# Patient Record
Sex: Male | Born: 1940
Health system: Southern US, Community
[De-identification: ages and names within clinical notes are randomized; demographics above are authoritative.]

## PROBLEM LIST (undated history)

## (undated) DIAGNOSIS — R12 Heartburn: Secondary | ICD-10-CM

## (undated) DIAGNOSIS — R740 Nonspecific elevation of levels of transaminase and lactic acid dehydrogenase [LDH]: Secondary | ICD-10-CM

## (undated) DIAGNOSIS — R6881 Early satiety: Secondary | ICD-10-CM

## (undated) DIAGNOSIS — R7401 Elevation of levels of liver transaminase levels: Secondary | ICD-10-CM

## (undated) DIAGNOSIS — S61209A Unspecified open wound of unspecified finger without damage to nail, initial encounter: Secondary | ICD-10-CM

## (undated) DIAGNOSIS — I10 Essential (primary) hypertension: Secondary | ICD-10-CM

## (undated) DIAGNOSIS — R1084 Generalized abdominal pain: Secondary | ICD-10-CM

## (undated) DIAGNOSIS — E039 Hypothyroidism, unspecified: Secondary | ICD-10-CM

## (undated) DIAGNOSIS — Z9889 Other specified postprocedural states: Secondary | ICD-10-CM

## (undated) DIAGNOSIS — J61 Pneumoconiosis due to asbestos and other mineral fibers: Secondary | ICD-10-CM

## (undated) DIAGNOSIS — F0390 Unspecified dementia without behavioral disturbance: Secondary | ICD-10-CM

## (undated) DIAGNOSIS — K219 Gastro-esophageal reflux disease without esophagitis: Secondary | ICD-10-CM

## (undated) DIAGNOSIS — F259 Schizoaffective disorder, unspecified: Secondary | ICD-10-CM

## (undated) HISTORY — PX: KNEE CARTILAGE SURGERY: SHX688

## (undated) HISTORY — DX: Heartburn: R12

## (undated) HISTORY — DX: Unspecified open wound of unspecified finger without damage to nail, initial encounter: S61.209A

## (undated) HISTORY — DX: Nonspecific elevation of levels of transaminase and lactic acid dehydrogenase (ldh): R74.0

## (undated) HISTORY — DX: Generalized abdominal pain: R10.84

## (undated) HISTORY — DX: Pneumoconiosis due to asbestos and other mineral fibers: J61

## (undated) HISTORY — PX: CHOLECYSTECTOMY: SHX55

## (undated) HISTORY — DX: Early satiety: R68.81

## (undated) HISTORY — DX: Elevation of levels of liver transaminase levels: R74.01

## (undated) HISTORY — DX: Other specified postprocedural states: Z98.890

---

## 2011-01-17 ENCOUNTER — Encounter (INDEPENDENT_AMBULATORY_CARE_PROVIDER_SITE_OTHER): Payer: Self-pay

## 2011-01-17 ENCOUNTER — Encounter (INDEPENDENT_AMBULATORY_CARE_PROVIDER_SITE_OTHER): Payer: Self-pay | Admitting: *Deleted

## 2011-02-08 ENCOUNTER — Encounter (INDEPENDENT_AMBULATORY_CARE_PROVIDER_SITE_OTHER): Payer: Self-pay | Admitting: Internal Medicine

## 2011-02-08 ENCOUNTER — Ambulatory Visit (INDEPENDENT_AMBULATORY_CARE_PROVIDER_SITE_OTHER): Payer: Medicare Other | Admitting: Internal Medicine

## 2011-02-08 DIAGNOSIS — R141 Gas pain: Secondary | ICD-10-CM

## 2011-02-08 DIAGNOSIS — R748 Abnormal levels of other serum enzymes: Secondary | ICD-10-CM

## 2011-02-08 DIAGNOSIS — R140 Abdominal distension (gaseous): Secondary | ICD-10-CM

## 2011-02-08 DIAGNOSIS — R6881 Early satiety: Secondary | ICD-10-CM

## 2011-02-08 DIAGNOSIS — R1011 Right upper quadrant pain: Secondary | ICD-10-CM

## 2011-02-08 LAB — CBC WITH DIFFERENTIAL/PLATELET
Basophils Absolute: 0 10*3/uL (ref 0.0–0.1)
Eosinophils Relative: 5 % (ref 0–5)
HCT: 42.9 % (ref 39.0–52.0)
Hemoglobin: 14 g/dL (ref 13.0–17.0)
Lymphocytes Relative: 35 % (ref 12–46)
Lymphs Abs: 1.5 10*3/uL (ref 0.7–4.0)
MCV: 100.2 fL — ABNORMAL HIGH (ref 78.0–100.0)
Monocytes Absolute: 0.5 10*3/uL (ref 0.1–1.0)
Monocytes Relative: 11 % (ref 3–12)
RDW: 13.9 % (ref 11.5–15.5)
WBC: 4.2 10*3/uL (ref 4.0–10.5)

## 2011-02-08 LAB — COMPREHENSIVE METABOLIC PANEL
Albumin: 4.4 g/dL (ref 3.5–5.2)
BUN: 18 mg/dL (ref 6–23)
CO2: 22 mEq/L (ref 19–32)
Calcium: 9.3 mg/dL (ref 8.4–10.5)
Chloride: 104 mEq/L (ref 96–112)
Creat: 1.04 mg/dL (ref 0.50–1.35)
Glucose, Bld: 91 mg/dL (ref 70–99)

## 2011-02-08 NOTE — Progress Notes (Signed)
Subjective:     Patient ID: Jonathon Grimes, male   DOB: 06/29/1940, 70 y.o.   MRN: [id number]  HPI  Jonathon Grimes is a 70 yr old male referred to our office for elevated transaminases. He tells me his liver enzymes have never been elevated before.  He tells me every now and then he rt upper quadrant pain.  He has had the pain for a couple of months. He has a hx of asbestosis which was diagnosed in 2001.  Appetite is good. No weight loss. He actually gain about 20lbs in the past 4-5 months.  He says he cannot digest a whole hamburger. He says he will get abdominal distention. He c/o early satiety. Symptoms of early satiety for the past 4-5 months. If he drinks Gatorade his stomach will become tight.  He denies acid reflux since his gallbladder was removed. He has a BM 1-2 times a day.  No rectal bleeding or melena.  No nausea, vomiting, or chills, no fever.  Denies etoh abuse.  No meds that would pump his transaminases up.    He had an US abdomen 12/06/2010 for early satiety.  CBD measures approximately 3-4 mm in greatest diameter intraluminal diameter. The gallbladder is surgically absent.  Left renal cyst. Calcifying atherosclerotic disease. ([facility])   Hereceived Hepatitis B vaccine in 1996  7/13/2012Hep A Ab, IgM negative, HBsAg Screen Negative, Hep B Core Ab, IgM negative, He C Ab negative.  12/29/2010 albumin 4.3, Total bili 0.2, direct 0.04, ALP 82, AST 169, ALT 70  Review of Systems see hpi Current Outpatient Prescriptions  Medication Sig Dispense Refill  . esomeprazole (NEXIUM) 40 MG capsule Take 40 mg by mouth daily before breakfast.         History   Social History Narrative  . No narrative on file   History   Social History  . Marital Status: Widowed    Spouse Name: N/A    Number of Children: N/A  . Years of Education: N/A   Occupational History  . Not on file.   Social History Main Topics  . Smoking status: Current Some Day Smoker  . Smokeless tobacco: Not on file   Comment: 1 pack a day  . Alcohol Use: No  . Drug Use: No  . Sexually Active: Not on file   Other Topics Concern  . Not on file   Social History Narrative  . No narrative on file   No family history on file. Family Status  Relation Status Death Age  . Mother Deceased     Liver cancer  . Father Deceased     MI  . Sister Alive     fair health  . Brother Other     One deceased: killed in [place], one alive in good health  . Child Other     One deceased from colon cancer at age 1, and one in good health.    Allergies  Allergen Reactions  . Celebrex (Celecoxib)    Past Surgical History  Procedure Date  . Cholecystectomy     1994 [place]: gallstones  . Knee cartilage surgery     Bilateral       Objective:   Physical ExamAlert and oriented. Skin warm and dry. Oral mucosa is moist. Natural teeth in good condition. Sclera anicteric, conjunctivae is pink. Thyroid not enlarged. No cervical lymphadenopathy. Lungs clear. Heart regular rate and rhythm.  Abdomen is soft. Bowel sounds are positive. I am unable to palpate his liver. Abdomen  is obese. No abdominal masses felt. No tenderness.  No edema to lower extremities. Patient is alert and oriented.      Assessment:    Elevated liver enzymes Abdominal distention, Early satiety. Liver disease such as an auto immune hepatitis process  needs to be ruled out.   . Would like to repeat to see if they have come down.  Early satiety.    Plan:    C-met, Will also get a CT abdomen with CM    I will discuss this case with Dr. Karilyn Cota.  He may need an EGD in the near future for early satiety, and abdominal distention.

## 2011-02-09 LAB — SEDIMENTATION RATE: Sed Rate: 9 mm/hr (ref 0–16)

## 2011-02-14 ENCOUNTER — Ambulatory Visit (HOSPITAL_COMMUNITY)
Admission: RE | Admit: 2011-02-14 | Discharge: 2011-02-14 | Disposition: A | Discharge status: Home / Self Care | Payer: Medicare Other | Source: Ambulatory Visit | Attending: Internal Medicine | Admitting: Internal Medicine

## 2011-02-14 DIAGNOSIS — R6881 Early satiety: Secondary | ICD-10-CM

## 2011-02-14 DIAGNOSIS — R1011 Right upper quadrant pain: Secondary | ICD-10-CM | POA: Insufficient documentation

## 2011-02-14 DIAGNOSIS — R748 Abnormal levels of other serum enzymes: Secondary | ICD-10-CM | POA: Insufficient documentation

## 2011-02-14 DIAGNOSIS — K7689 Other specified diseases of liver: Secondary | ICD-10-CM | POA: Insufficient documentation

## 2011-02-14 DIAGNOSIS — K5730 Diverticulosis of large intestine without perforation or abscess without bleeding: Secondary | ICD-10-CM | POA: Insufficient documentation

## 2011-02-14 DIAGNOSIS — R140 Abdominal distension (gaseous): Secondary | ICD-10-CM

## 2011-02-14 MED ORDER — IOHEXOL 300 MG/ML  SOLN
100.0000 mL | Freq: Once | INTRAMUSCULAR | Status: AC | PRN
  Administered 2011-02-14: 100 mL via INTRAVENOUS

## 2011-02-16 ENCOUNTER — Telehealth (INDEPENDENT_AMBULATORY_CARE_PROVIDER_SITE_OTHER): Payer: Self-pay | Admitting: Internal Medicine

## 2011-02-16 DIAGNOSIS — K769 Liver disease, unspecified: Secondary | ICD-10-CM

## 2011-02-16 NOTE — Telephone Encounter (Signed)
Results have been given to patient. He will need an MR abdomen with/without contrast

## 2011-02-16 NOTE — Telephone Encounter (Signed)
Addended by: Len Blalock on: 02/16/2011 12:18 PM   Modules accepted: Orders

## 2011-02-21 ENCOUNTER — Ambulatory Visit (HOSPITAL_COMMUNITY)
Admission: RE | Admit: 2011-02-21 | Discharge: 2011-02-21 | Disposition: A | Discharge status: Home / Self Care | Payer: Medicare Other | Source: Ambulatory Visit | Attending: Internal Medicine | Admitting: Internal Medicine

## 2011-02-21 DIAGNOSIS — K769 Liver disease, unspecified: Secondary | ICD-10-CM

## 2011-02-21 DIAGNOSIS — R109 Unspecified abdominal pain: Secondary | ICD-10-CM | POA: Insufficient documentation

## 2011-02-21 MED ORDER — GADOBENATE DIMEGLUMINE 529 MG/ML IV SOLN
20.0000 mL | Freq: Once | INTRAVENOUS | Status: AC | PRN
  Administered 2011-02-21: 20 mL via INTRAVENOUS

## 2011-02-22 ENCOUNTER — Telehealth (INDEPENDENT_AMBULATORY_CARE_PROVIDER_SITE_OTHER): Payer: Self-pay | Admitting: Internal Medicine

## 2011-02-22 DIAGNOSIS — R748 Abnormal levels of other serum enzymes: Secondary | ICD-10-CM

## 2011-02-22 NOTE — Telephone Encounter (Signed)
Will get a SMA, ANA , ceruloplasma, ferritin , Alpha 1 antitrypsin

## 2011-02-23 ENCOUNTER — Telehealth (INDEPENDENT_AMBULATORY_CARE_PROVIDER_SITE_OTHER): Payer: Self-pay | Admitting: *Deleted

## 2011-02-23 DIAGNOSIS — R6881 Early satiety: Secondary | ICD-10-CM

## 2011-02-23 DIAGNOSIS — R140 Abdominal distension (gaseous): Secondary | ICD-10-CM

## 2011-02-23 NOTE — Telephone Encounter (Signed)
EGD sch'd 03/30/11 @ 3:30 (2:30), patient aware, instructions mailed

## 2011-02-24 ENCOUNTER — Telehealth (INDEPENDENT_AMBULATORY_CARE_PROVIDER_SITE_OTHER): Payer: Self-pay | Admitting: *Deleted

## 2011-02-24 ENCOUNTER — Telehealth (INDEPENDENT_AMBULATORY_CARE_PROVIDER_SITE_OTHER): Payer: Self-pay | Admitting: Internal Medicine

## 2011-02-24 NOTE — Telephone Encounter (Signed)
ALREADY SCH'D FOR 03/30/11

## 2011-02-24 NOTE — Telephone Encounter (Signed)
Results of MRI given to patient.  He will need an office visit in November with a hepatic function.    Ann, Needs appt in November after EGD    Tammy, Hepatic function in November

## 2011-02-24 NOTE — Telephone Encounter (Signed)
Per Camelia Eng , The patient will need this lab prior to November appointment.

## 2011-02-27 ENCOUNTER — Other Ambulatory Visit (INDEPENDENT_AMBULATORY_CARE_PROVIDER_SITE_OTHER): Payer: Self-pay | Admitting: Internal Medicine

## 2011-02-28 LAB — FERRITIN: Ferritin: 204 ng/mL (ref 22–322)

## 2011-02-28 LAB — CERULOPLASMIN: Ceruloplasmin: 21 mg/dL (ref 20–60)

## 2011-02-28 LAB — ALPHA-1-ANTITRYPSIN: A-1 Antitrypsin, Ser: 136 mg/dL (ref 90–200)

## 2011-02-28 LAB — ANA: Anti Nuclear Antibody(ANA): POSITIVE — AB

## 2011-03-01 LAB — ANTI-SMOOTH MUSCLE ANTIBODY, IGG: Smooth Muscle Ab: 10 U (ref <20)

## 2011-03-16 ENCOUNTER — Other Ambulatory Visit (INDEPENDENT_AMBULATORY_CARE_PROVIDER_SITE_OTHER): Payer: Self-pay | Admitting: *Deleted

## 2011-03-16 DIAGNOSIS — R140 Abdominal distension (gaseous): Secondary | ICD-10-CM

## 2011-03-16 DIAGNOSIS — R6881 Early satiety: Secondary | ICD-10-CM

## 2011-03-29 MED ORDER — SODIUM CHLORIDE 0.45 % IV SOLN: Freq: Once | INTRAVENOUS | Status: DC

## 2011-03-30 ENCOUNTER — Ambulatory Visit (HOSPITAL_COMMUNITY)
Admission: RE | Admit: 2011-03-30 | Discharge: 2011-03-30 | Disposition: A | Discharge status: Home / Self Care | Payer: Medicare Other | Source: Ambulatory Visit | Attending: Internal Medicine | Admitting: Internal Medicine

## 2011-03-30 ENCOUNTER — Encounter (HOSPITAL_COMMUNITY)
Admission: RE | Disposition: A | Discharge status: Home / Self Care | Payer: Self-pay | Source: Ambulatory Visit | Attending: Internal Medicine

## 2011-03-30 ENCOUNTER — Other Ambulatory Visit (INDEPENDENT_AMBULATORY_CARE_PROVIDER_SITE_OTHER): Payer: Self-pay | Admitting: Internal Medicine

## 2011-03-30 DIAGNOSIS — R109 Unspecified abdominal pain: Secondary | ICD-10-CM | POA: Insufficient documentation

## 2011-03-30 DIAGNOSIS — K449 Diaphragmatic hernia without obstruction or gangrene: Secondary | ICD-10-CM | POA: Insufficient documentation

## 2011-03-30 DIAGNOSIS — K229 Disease of esophagus, unspecified: Secondary | ICD-10-CM

## 2011-03-30 DIAGNOSIS — K2940 Chronic atrophic gastritis without bleeding: Secondary | ICD-10-CM | POA: Insufficient documentation

## 2011-03-30 DIAGNOSIS — R140 Abdominal distension (gaseous): Secondary | ICD-10-CM

## 2011-03-30 DIAGNOSIS — R1900 Intra-abdominal and pelvic swelling, mass and lump, unspecified site: Secondary | ICD-10-CM

## 2011-03-30 DIAGNOSIS — R6881 Early satiety: Secondary | ICD-10-CM | POA: Insufficient documentation

## 2011-03-30 DIAGNOSIS — K2960 Other gastritis without bleeding: Secondary | ICD-10-CM

## 2011-03-30 HISTORY — PX: ESOPHAGOGASTRODUODENOSCOPY: SHX5428

## 2011-03-30 SURGERY — EGD (ESOPHAGOGASTRODUODENOSCOPY)
Anesthesia: Moderate Sedation

## 2011-03-30 MED ORDER — MEPERIDINE HCL 50 MG/ML IJ SOLN
INTRAMUSCULAR | Status: AC
  Filled 2011-03-30: qty 1

## 2011-03-30 MED ORDER — MIDAZOLAM HCL 5 MG/5ML IJ SOLN
INTRAMUSCULAR | Status: AC
  Filled 2011-03-30: qty 10

## 2011-03-30 MED ORDER — MIDAZOLAM HCL 5 MG/5ML IJ SOLN
INTRAMUSCULAR | Status: DC | PRN
  Administered 2011-03-30 (×2): 2 mg via INTRAVENOUS

## 2011-03-30 MED ORDER — MEPERIDINE HCL 25 MG/ML IJ SOLN
INTRAMUSCULAR | Status: DC | PRN
  Administered 2011-03-30 (×2): 25 mg via INTRAVENOUS

## 2011-03-30 MED ORDER — BUTAMBEN-TETRACAINE-BENZOCAINE 2-2-14 % EX AERO
INHALATION_SPRAY | CUTANEOUS | Status: DC | PRN
  Administered 2011-03-30: 2 via TOPICAL

## 2011-03-30 NOTE — H&P (Signed)
Jonathon Grimes is an 70 y.o. male.   Chief Complaint: Patient is here for EGD. HPI: Patient is 70 year old Caucasian male who presents with 5-6 month history of postprandial bloating epigastric and right upper quadrant pain as well as early satiety. He states he cannot eat a regular meal. He develops nausea but no vomiting. He rarely has heartburn but is on his Nexium. He is status post cholecystectomy few years ago. Despite his ongoing symptoms he has gained 20 pounds. He denies melena,rectal bleeding, diarrhea or constipation. About 6 weeks ago he was found to have elevated transaminases but his workup has been negative including viral markers studies to she has not had any alcohol in over 30 years. September he had abdominopelvic CT which revealed 2 lesions in the liver and an MRI of these were confirmed to be hemangiomas. His bile duct was not dilated without stones. She does not take any NSAIDs Patient had a colonoscopy about 2 years ago; it  was unremarkable  Past Medical History  Diagnosis Date  . Abdominal pain, generalized   . Nonspecific elevation of levels of transaminase or lactic acid dehydrogenase (LDH)   . Open wound of finger(s) , without mention of complication   . Heartburn   . Early satiety   . History of colonoscopy     2008 Dr. Gabriel Cirri and was normal.    Past Surgical History  Procedure Date  . Cholecystectomy     1994 [place]: gallstones  . Knee cartilage surgery     Bilateral    No family history on file. Social History:  reports that he has been smoking.  He does not have any smokeless tobacco history on file. He reports that he does not drink alcohol or use illicit drugs.  Allergies:  Allergies  Allergen Reactions  . Celebrex (Celecoxib) Other (See Comments)    Unknown    Medications Prior to Admission  Medication Dose Route Frequency Provider Last Rate Last Dose  . 0.45 % sodium chloride infusion   Intravenous Once [name], MD      . meperidine  (DEMEROL) 50 MG/ML injection           . midazolam (VERSED) 5 MG/5ML injection            Medications Prior to Admission  Medication Sig Dispense Refill  . esomeprazole (NEXIUM) 40 MG capsule Take 40 mg by mouth daily before breakfast.          No results found for this or any previous visit (from the past 48 hour(s)). No results found.  Review of Systems  Constitutional: Weight loss: 20 pound weight gain this year.  Gastrointestinal: Positive for heartburn (occasional breakthrough heartburn) and nausea. Negative for vomiting, diarrhea, constipation, blood in stool and melena. Abdominal pain: postprandial fullness and pain in epigastric region and right upper quadrant.    Blood pressure 111/62, pulse 54, temperature 98.3 F (36.8 C), temperature source Oral, resp. rate 18, height 5\' 8"  (1.727 m), weight 198 lb (89.812 kg), SpO2 94.00%. Physical Exam  Constitutional: He appears well-developed and well-nourished.  HENT:  Mouth/Throat: Oropharynx is clear and moist.  Eyes: Conjunctivae are normal. No scleral icterus.  Neck: No thyromegaly present.  Cardiovascular: Normal rate, regular rhythm and normal heart sounds.   No murmur heard. Respiratory: Effort normal and breath sounds normal.  GI: Soft. He exhibits no distension and no mass. There is Tenderness: mild tenderness in midepigastric region and below the right costal margin..  Musculoskeletal: He exhibits  no edema.  Lymphadenopathy:    He has no cervical adenopathy.  Neurological: He is alert.  Skin: Skin is warm and dry.     Assessment/Plan Postprandial bloating epigastric pain and early satiety. Poor response to PPI and negative workup to abdominopelvic CT and MRI. Diagnostic EGD  [name] 03/30/2011, 3:43 PM

## 2011-03-30 NOTE — Op Note (Signed)
EGD PROCEDURE REPORT  PATIENT:  Jonathon Grimes  MR#:  [id number] Birthdate:  26-Jun-1940, 70 y.o., male Endoscopist:  Dr. [name], MD Referred By:  Dr. Fara Chute, MD Procedure Date: 03/30/2011  Procedure:   EGD  Indications:  Patient is 70 year old Caucasian male with a postprandial bloating, early satiety and vague upper abdominal pain. His symptoms have not responded to PPI. July he was also found to have mildly elevated transaminases. Imaging studies have been negative for pancreatic disease or choledocholithiasis(ultrasound, Jonathon Grimes pelvic CT and MRI) his markers for hepatitis B and C. have been negative. At normal serum ferritin alpha-1 antitrypsin weakly positive ANA with negative SMA.  His transaminases have improved. Today he is undergoing diagnostic EGD to make sure he does not have peptic ulcer disease or neoplasm.            Informed Consent:  Procedure and risks were reviewed with the patient and informed consent was obtained. Medications:  Demerol 50 mg IV Versed 4 mg IV Cetacaine spray topically for oropharyngeal anesthesia  Description of procedure:  The endoscope was introduced through the mouth and advanced to the second portion of the duodenum without difficulty or limitations. The mucosal surfaces were surveyed very carefully during advancement of the scope and upon withdrawal.  Findings:  Esophagus:  Mucosa of the esophagus was normal. GE junction was unremarkable/ GEJ:  38 cm Hiatus:  40 cm Stomach:  Stomach was empty and distended very well with insufflation. Folds in the proximal stomach were normal. Examination mucosa revealed patchy linear erythema involving the antrum mucosa along with few areas that appeared to be healed erosions. Pyloric channel was patent. Annularis fundus and cardia were examined by the fracture the scope and were normal. Antral biopsy was taken for routine histology. Duodenum:  Bulbar mucosa was normal. The scope was passed to the second  part of the duodenum where mucosa appear to be abnormal with with thick white villi. Therefore biopsy was taken from this segment for routine histology.  Therapeutic/Diagnostic Maneuvers Performed:  See above  Complications:  None  Impression: Small sliding hiatal hernia without evidence of erosive esophagitis. Antral gastritis without peptic ulcer disease or pyloric stenosis. Antral biopsy taken. Abnormal appearance to post bulbar mucosa. Biopsy taken to rule out mucosal disease.  Recommendations: Continue anti-reflux measures and Nexium while we wait for the results of biopsy. I will be contacting patient with results of biopsy and further recommendations.  Jonathon Grimes,Jonathon Grimes  03/30/2011  4:08 PM  CC: Dr. [name] M.D.

## 2011-04-05 ENCOUNTER — Encounter (INDEPENDENT_AMBULATORY_CARE_PROVIDER_SITE_OTHER): Payer: Self-pay | Admitting: *Deleted

## 2011-04-06 ENCOUNTER — Encounter (HOSPITAL_COMMUNITY): Payer: Self-pay | Admitting: Internal Medicine

## 2011-04-07 ENCOUNTER — Encounter (INDEPENDENT_AMBULATORY_CARE_PROVIDER_SITE_OTHER): Payer: Self-pay | Admitting: *Deleted

## 2011-09-25 ENCOUNTER — Ambulatory Visit (INDEPENDENT_AMBULATORY_CARE_PROVIDER_SITE_OTHER): Payer: Medicare Other | Admitting: Internal Medicine

## 2011-09-25 ENCOUNTER — Encounter (INDEPENDENT_AMBULATORY_CARE_PROVIDER_SITE_OTHER): Payer: Self-pay | Admitting: Internal Medicine

## 2011-09-25 VITALS — BP 126/70 | HR 72 | Temp 98.3°F | Ht 68.0 in | Wt 191.4 lb

## 2011-09-25 DIAGNOSIS — K219 Gastro-esophageal reflux disease without esophagitis: Secondary | ICD-10-CM | POA: Diagnosis not present

## 2011-09-25 DIAGNOSIS — J61 Pneumoconiosis due to asbestos and other mineral fibers: Secondary | ICD-10-CM | POA: Insufficient documentation

## 2011-09-25 DIAGNOSIS — K5289 Other specified noninfective gastroenteritis and colitis: Secondary | ICD-10-CM

## 2011-09-25 DIAGNOSIS — K529 Noninfective gastroenteritis and colitis, unspecified: Secondary | ICD-10-CM | POA: Insufficient documentation

## 2011-09-25 NOTE — Patient Instructions (Signed)
Continue present medications. OV in 6 months. 

## 2011-09-25 NOTE — Progress Notes (Signed)
Subjective:     Patient ID: Jonathon Grimes, male   DOB: Feb 06, 1941, 71 y.o.   MRN: [id number]  HPI  Jonathon Grimes is a 71 yr old male presenting today stated he is feeling better. He tells me that he had nauseated x 3 weeks. Thinks he may have had a stomach virus.  Everytime he ate anything greasy, he would have to go to the bathroom and have a BM. He did have a fever during this time. Having 2-3 BMs during this time. Some were loose and some were normal.  He came by office 3 days ago and given an Rx for Dicyclomie  He tells me he is 95% better at this time.  No nausea now. No dysphagia.  Underwent a cholecystectomy several years ago. Now he is having 1-2 BMs daily. Small amt of stomach cramps with his BM. No melena or bright red rectal bleeding. Appetite is good. No weight loss.  He says he has been eating a lot of fruits recently.  He says the Dicylcomine has also helped.    EGD  03/2011:Impression:  Small sliding hiatal hernia without evidence of erosive esophagitis.  Antral gastritis without peptic ulcer disease or pyloric stenosis. Antral biopsy taken.  Abnormal appearance to post bulbar mucosa. Biopsy taken to rule out mucosal disease.     Review of Systems see hpi Current Outpatient Prescriptions  Medication Sig Dispense Refill  . esomeprazole (NEXIUM) 40 MG capsule Take 40 mg by mouth daily before breakfast.         Past Medical History  Diagnosis Date  . Abdominal pain, generalized   . Nonspecific elevation of levels of transaminase or lactic acid dehydrogenase (LDH)   . Open wound of finger(s) , without mention of complication   . Heartburn   . Early satiety   . History of colonoscopy     2008 Dr. Gabriel Cirri and was normal.  . Asbestosis     diagnosed in 2001   Past Surgical History  Procedure Date  . Cholecystectomy     1994 [place]: gallstones  . Knee cartilage surgery     Bilateral  . Esophagogastroduodenoscopy 03/30/2011    Procedure: ESOPHAGOGASTRODUODENOSCOPY (EGD);   Surgeon: [name], MD;  Location: [facility];  Service: Endoscopy;  Laterality: N/A;   Family Status  Relation Status Death Age  . Mother Deceased     Liver cancer  . Father Deceased     MI  . Sister Alive     fair health  . Brother Other     One deceased: killed in [place], one alive in good health  . Child Other     One deceased from colon cancer at age 56, and one in good health.    History   Social History  . Marital Status: Widowed    Spouse Name: N/A    Number of Children: N/A  . Years of Education: N/A   Occupational History  . Not on file.   Social History Main Topics  . Smoking status: Current Some Day Smoker  . Smokeless tobacco: Not on file   Comment: 1 pack a day  . Alcohol Use: No  . Drug Use: No  . Sexually Active: Not on file   Other Topics Concern  . Not on file   Social History Narrative  . No narrative on file   Allergies  Allergen Reactions  . Celebrex (Celecoxib) Other (See Comments)    Unknown       Objective:  Physical Exam Filed Vitals:   09/25/11 1414  Height: 5\' 8"  (1.727 m)  Weight: 191 lb 6.4 oz (86.818 kg)   Alert and oriented. Skin warm and dry. Oral mucosa is moist.   . Sclera anicteric, conjunctivae is pink. Thyroid not enlarged. No cervical lymphadenopathy. Lungs clear. Heart regular rate and rhythm.  Abdomen is soft. Bowel sounds are positive. No hepatomegaly. No abdominal masses felt. No tenderness.  No edema to lower extremities.       Assessment:    Probable viral syndrome given hx of fever with symptoms. He is 95% better at this time.    Plan:    Continue present medication. OV 6 months.  Try to avoid greasy foods.

## 2011-09-28 ENCOUNTER — Encounter (INDEPENDENT_AMBULATORY_CARE_PROVIDER_SITE_OTHER): Payer: Self-pay

## 2011-10-09 ENCOUNTER — Encounter (INDEPENDENT_AMBULATORY_CARE_PROVIDER_SITE_OTHER): Payer: Self-pay

## 2012-01-24 DIAGNOSIS — Z113 Encounter for screening for infections with a predominantly sexual mode of transmission: Secondary | ICD-10-CM | POA: Diagnosis not present

## 2012-02-06 DIAGNOSIS — Z113 Encounter for screening for infections with a predominantly sexual mode of transmission: Secondary | ICD-10-CM | POA: Diagnosis not present

## 2012-03-06 ENCOUNTER — Encounter (INDEPENDENT_AMBULATORY_CARE_PROVIDER_SITE_OTHER): Payer: Self-pay | Admitting: *Deleted

## 2012-03-26 ENCOUNTER — Ambulatory Visit (INDEPENDENT_AMBULATORY_CARE_PROVIDER_SITE_OTHER): Payer: Medicare Other | Admitting: Internal Medicine

## 2012-03-26 ENCOUNTER — Telehealth (INDEPENDENT_AMBULATORY_CARE_PROVIDER_SITE_OTHER): Payer: Self-pay | Admitting: Internal Medicine

## 2012-03-26 ENCOUNTER — Encounter (INDEPENDENT_AMBULATORY_CARE_PROVIDER_SITE_OTHER): Payer: Self-pay | Admitting: Internal Medicine

## 2012-03-26 VITALS — BP 100/62 | HR 60 | Temp 97.7°F | Ht 68.0 in | Wt 201.0 lb

## 2012-03-26 DIAGNOSIS — K219 Gastro-esophageal reflux disease without esophagitis: Secondary | ICD-10-CM

## 2012-03-26 NOTE — Progress Notes (Signed)
Subjective:     Patient ID: Jonathon Grimes, male   DOB: 1940/06/03, 71 y.o.   MRN: [id number]  [place] presents today for follow up. Hx GERD.  Says he is doing good. Staying busy. He says is acid reflux is controled. Appetite is good since he quit smoking.  BMs are good. No melena or bright red rectal bleeding.    Hx of elevated tranaminases. Work up for his elevated tranaminases were negative. 01/2011 MRI abdomen: 1. Small benign hepatic hemangiomas, which correlate with the  lesions seen on recent CT.  2. No other significant abnormality identified.    08/2011 :HPI Jonathon Grimes is a 71 yr old male presenting today stated he is feeling better. He tells me that he had nauseated x 3 weeks. Thinks he may have had a stomach virus. Everytime he ate anything greasy, he would have to go to the bathroom and have a BM. He did have a fever during this time. Having 2-3 BMs during this time. Some were loose and some were normal. He came by office 3 days ago and given an Rx for Dicyclomie He tells me he is 95% better at this time. No nausea now. No dysphagia. Underwent a cholecystectomy several years ago. Now he is having 1-2 BMs daily. Small amt of stomach cramps with his BM. No melena or bright red rectal bleeding.  Appetite is good. No weight loss. He says he has been eating a lot of fruits recently. He says the Dicylcomine has also helped.  EGD 03/2011:Impression:  Small sliding hiatal hernia without evidence of erosive esophagitis.  Antral gastritis without peptic ulcer disease or pyloric stenosis. Antral biopsy taken.  Abnormal appearance to post bulbar mucosa. Biopsy taken to rule out mucosal disease.  Review of Systems Past Medical History  Diagnosis Date  . Abdominal pain, generalized   . Elevated aspartate aminotransferase level   . Stab wound of finger   . Heartburn   . Early satiety   . History of colonoscopy     2008 Dr. Gabriel Cirri and was normal.  . Asbestosis     diagnosed in 2001         Objective:   Physical Exam  Filed Vitals:   03/26/12 1414  BP: 100/62  Pulse: 60  Temp: 97.7 F (36.5 C)  Height: 5\' 8"  (1.727 m)  Weight: 201 lb (91.173 kg)   Alert and oriented. Skin warm and dry. Oral mucosa is moist.   . Sclera anicteric, conjunctivae is pink. Thyroid not enlarged. No cervical lymphadenopathy. Lungs clear. Heart regular rate and rhythm.  Abdomen is soft. Bowel sounds are positive. No hepatomegaly. No abdominal masses felt. No tenderness.  No edema to lower extremities.       Assessment:    Genella Rife controlled at this time. No GI problems. Elevated transaminases. Will repeat cmet this week.    Plan:    Cmet this week. OV in 6 months.

## 2012-03-26 NOTE — Patient Instructions (Addendum)
Continue present medications. OV in 6 months. 

## 2012-03-26 NOTE — Telephone Encounter (Signed)
Jonathon Grimes needs cmet next week.

## 2012-03-27 ENCOUNTER — Telehealth (INDEPENDENT_AMBULATORY_CARE_PROVIDER_SITE_OTHER): Payer: Self-pay | Admitting: *Deleted

## 2012-03-27 ENCOUNTER — Encounter (INDEPENDENT_AMBULATORY_CARE_PROVIDER_SITE_OTHER): Payer: Self-pay | Admitting: *Deleted

## 2012-03-27 DIAGNOSIS — R140 Abdominal distension (gaseous): Secondary | ICD-10-CM

## 2012-03-27 DIAGNOSIS — K219 Gastro-esophageal reflux disease without esophagitis: Secondary | ICD-10-CM

## 2012-03-27 NOTE — Telephone Encounter (Signed)
Lab work due 

## 2012-03-27 NOTE — Telephone Encounter (Signed)
Per Delrae Rend the patient will need to have labs the week of 04-01-12

## 2012-03-27 NOTE — Telephone Encounter (Signed)
Lab in noted for 04-03-12 , letter will go out either today or tomorrow as a reminder

## 2012-06-05 DIAGNOSIS — H43399 Other vitreous opacities, unspecified eye: Secondary | ICD-10-CM | POA: Diagnosis not present

## 2012-07-25 ENCOUNTER — Encounter (INDEPENDENT_AMBULATORY_CARE_PROVIDER_SITE_OTHER): Payer: Self-pay | Admitting: *Deleted

## 2012-09-24 ENCOUNTER — Encounter (INDEPENDENT_AMBULATORY_CARE_PROVIDER_SITE_OTHER): Payer: Self-pay | Admitting: Internal Medicine

## 2012-09-24 ENCOUNTER — Ambulatory Visit (INDEPENDENT_AMBULATORY_CARE_PROVIDER_SITE_OTHER): Payer: Medicare Other | Admitting: Internal Medicine

## 2012-09-24 VITALS — BP 122/62 | HR 60 | Ht 68.0 in | Wt 204.0 lb

## 2012-09-24 DIAGNOSIS — R748 Abnormal levels of other serum enzymes: Secondary | ICD-10-CM

## 2012-09-24 DIAGNOSIS — R7401 Elevation of levels of liver transaminase levels: Secondary | ICD-10-CM

## 2012-09-24 LAB — COMPREHENSIVE METABOLIC PANEL
ALT: 62 U/L — ABNORMAL HIGH (ref 0–53)
AST: 153 U/L — ABNORMAL HIGH (ref 0–37)
Albumin: 4 g/dL (ref 3.5–5.2)
BUN: 15 mg/dL (ref 6–23)
CO2: 26 mEq/L (ref 19–32)
Calcium: 9.1 mg/dL (ref 8.4–10.5)
Chloride: 106 mEq/L (ref 96–112)
Potassium: 4.5 mEq/L (ref 3.5–5.3)

## 2012-09-24 NOTE — Patient Instructions (Addendum)
Will check liver enzymes today. OV in 1 yr. Call if any problems. No NSAIDs

## 2012-09-24 NOTE — Progress Notes (Addendum)
Subjective:     Patient ID: Jonathon Grimes, male   DOB: 04-09-1941, 72 y.o.   MRN: [id number]  HPI Here today for f/u of his GERD. Says he is doing good. He is not on a PPI. He has been off the Nexium for about 2 months.  No abdominal pain. Appetite is good. Quit smoking almost 1 yrs ago. He has a BM about two times a day.  Hx of elevated tranaminases. He had negative Hepatitis viral markers. Auto immune process was ruled out.     01/2011 MRI abdomen: 1. Small benign hepatic hemangiomas, which correlate with the  lesions seen on recent CT.  2. No other significant abnormality identified.   EGD 03/2011:Impression:  Small sliding hiatal hernia without evidence of erosive esophagitis.  Antral gastritis without peptic ulcer disease or pyloric stenosis. Antral biopsy taken.  Abnormal appearance to post bulbar mucosa. Biopsy taken to rule out mucosal disease.     Review of Systems see hpi No current outpatient prescriptions on file.   No current facility-administered medications for this visit.   Past Medical History  Diagnosis Date  . Abdominal pain, generalized   . Nonspecific elevation of levels of transaminase or lactic acid dehydrogenase (LDH)   . Open wound of finger(s) , without mention of complication   . Heartburn   . Early satiety   . History of colonoscopy     2008 Dr. Gabriel Cirri and was normal.  . Asbestosis     diagnosed in 2001   Past Surgical History  Procedure Laterality Date  . Cholecystectomy      1994 [place]: gallstones  . Knee cartilage surgery      Bilateral  . Esophagogastroduodenoscopy  03/30/2011    Procedure: ESOPHAGOGASTRODUODENOSCOPY (EGD);  Surgeon: [name], MD;  Location: [facility];  Service: Endoscopy;  Laterality: N/A;   Allergies  Allergen Reactions  . Celebrex (Celecoxib) Other (See Comments)    Unknown         Objective:   Physical Exam  Filed Vitals:   09/24/12 1443  BP: 122/62  Pulse: 60  Height: 5\' 8"  (1.727 m)   Weight: 204 lb (92.534 kg)   Alert and oriented. Skin warm and dry. Oral mucosa is moist.   . Sclera anicteric, conjunctivae is pink. Thyroid not enlarged. No cervical lymphadenopathy. Lungs clear. Heart regular rate and rhythm.  Abdomen is soft. Bowel sounds are positive. No hepatomegaly. No abdominal masses felt. No tenderness.  No edema to lower extremities.       Assessment:    GERD: controlled at this time. No acid reflux. Elevated transaminases:Work up was negative.     Plan:    Cmet today. OV in 1 yr.

## 2013-05-27 DIAGNOSIS — H1130 Conjunctival hemorrhage, unspecified eye: Secondary | ICD-10-CM | POA: Diagnosis not present

## 2013-06-16 ENCOUNTER — Encounter (INDEPENDENT_AMBULATORY_CARE_PROVIDER_SITE_OTHER): Payer: Self-pay | Admitting: *Deleted

## 2013-08-13 DIAGNOSIS — H35039 Hypertensive retinopathy, unspecified eye: Secondary | ICD-10-CM | POA: Diagnosis not present

## 2013-08-14 ENCOUNTER — Encounter (INDEPENDENT_AMBULATORY_CARE_PROVIDER_SITE_OTHER): Payer: Medicare Other | Admitting: Ophthalmology

## 2013-08-14 DIAGNOSIS — I10 Essential (primary) hypertension: Secondary | ICD-10-CM

## 2013-08-14 DIAGNOSIS — H2510 Age-related nuclear cataract, unspecified eye: Secondary | ICD-10-CM

## 2013-08-14 DIAGNOSIS — H43819 Vitreous degeneration, unspecified eye: Secondary | ICD-10-CM | POA: Diagnosis not present

## 2013-08-14 DIAGNOSIS — H348392 Tributary (branch) retinal vein occlusion, unspecified eye, stable: Secondary | ICD-10-CM | POA: Diagnosis not present

## 2013-08-14 DIAGNOSIS — H35039 Hypertensive retinopathy, unspecified eye: Secondary | ICD-10-CM

## 2013-08-27 HISTORY — PX: EYE SURGERY: SHX253

## 2013-09-11 ENCOUNTER — Encounter (INDEPENDENT_AMBULATORY_CARE_PROVIDER_SITE_OTHER): Payer: Medicare Other | Admitting: Ophthalmology

## 2013-09-11 DIAGNOSIS — H348392 Tributary (branch) retinal vein occlusion, unspecified eye, stable: Secondary | ICD-10-CM | POA: Diagnosis not present

## 2013-09-11 DIAGNOSIS — H35039 Hypertensive retinopathy, unspecified eye: Secondary | ICD-10-CM | POA: Diagnosis not present

## 2013-09-11 DIAGNOSIS — I10 Essential (primary) hypertension: Secondary | ICD-10-CM

## 2013-09-11 DIAGNOSIS — H43819 Vitreous degeneration, unspecified eye: Secondary | ICD-10-CM | POA: Diagnosis not present

## 2013-09-30 ENCOUNTER — Ambulatory Visit (INDEPENDENT_AMBULATORY_CARE_PROVIDER_SITE_OTHER): Payer: Medicare Other | Admitting: Internal Medicine

## 2013-09-30 ENCOUNTER — Other Ambulatory Visit (INDEPENDENT_AMBULATORY_CARE_PROVIDER_SITE_OTHER): Payer: Self-pay | Admitting: Internal Medicine

## 2013-09-30 ENCOUNTER — Encounter (INDEPENDENT_AMBULATORY_CARE_PROVIDER_SITE_OTHER): Payer: Self-pay | Admitting: Internal Medicine

## 2013-09-30 VITALS — BP 120/70 | HR 64 | Temp 98.5°F | Resp 18 | Ht 68.0 in | Wt 203.2 lb

## 2013-09-30 DIAGNOSIS — R748 Abnormal levels of other serum enzymes: Secondary | ICD-10-CM

## 2013-09-30 DIAGNOSIS — K739 Chronic hepatitis, unspecified: Secondary | ICD-10-CM | POA: Diagnosis not present

## 2013-10-01 LAB — HEPATIC FUNCTION PANEL
ALK PHOS: 64 U/L (ref 39–117)
ALT: 44 U/L (ref 0–53)
AST: 150 U/L — AB (ref 0–37)
Albumin: 4.5 g/dL (ref 3.5–5.2)
BILIRUBIN DIRECT: 0.1 mg/dL (ref 0.0–0.3)
BILIRUBIN INDIRECT: 0.4 mg/dL (ref 0.2–1.2)
Total Bilirubin: 0.5 mg/dL (ref 0.2–1.2)
Total Protein: 7.7 g/dL (ref 6.0–8.3)

## 2013-10-01 LAB — ANTI-NUCLEAR AB-TITER (ANA TITER)

## 2013-10-01 LAB — SEDIMENTATION RATE: SED RATE: 23 mm/h — AB (ref 0–16)

## 2013-10-01 LAB — ANA: Anti Nuclear Antibody(ANA): POSITIVE — AB

## 2013-10-01 NOTE — Progress Notes (Signed)
Presenting complaint;  Followup for elevated transaminases.  Database;  Jonathon Grimes is  73 year old Caucasian male was here for yearly visit. He has history of elevated transaminases dating back to July 2012. Workup included upper abdominal ultrasound which revealed no abnormalities the liver or bile duct. He has had cholecystectomy previously. Viral markers for hepatitis A., B. and C. were negative. Following his visit to our office in September 2012 further workup included; Normal ceruloplasmin and alpha 1 anti-trypsin levels. Normal serum ferritin level. ANA was positive. He had abdominopelvic CT on 02/16/2011 revealing 2 low attenuation lesions within the liver which were further evaluated with MRI and confirmed to be small hemangiomas. It was decided to follow patient clinically. AST and ALT were 153 and 62 respectively on 09/24/2012.  Subjective;  He has no complaints. He has very good appetite. His weight has been stable since his last visit one year ago. He denies abdominal pain pruritus nausea vomiting melena rectal bleeding diarrhea or constipation. He states he feels fatigued at times but he is able to do the things that he needs to. He has history of asbestos exposure and is evaluated every year and so far so good.   Current Medications: No outpatient encounter prescriptions on file as of 09/30/2013.     Objective: Blood pressure 120/70, pulse 64, temperature 98.5 F (36.9 C), temperature source Oral, resp. rate 18, height 5\' 8"  (1.727 m), weight 203 lb 3.2 oz (92.171 kg). Patient is alert and in no acute distress. Conjunctiva is pink. Sclera is nonicteric Oropharyngeal mucosa is normal. No neck masses or thyromegaly noted. Cardiac exam with regular rhythm normal S1 and S2. No murmur or gallop noted. Lungs are clear to auscultation. Abdomen full but soft and nontender without organomegaly or masses  No LE edema or clubbing noted.  Labs/studies Results: See database.     Assessment:  #1. Chronically elevated transaminases dating back to July 2012. Workup has been negative and he has no symptoms. Certainly could have fatty liver but ultrasound was negative. Need to make sure he does not have low grade or smoldering autoimmune hepatitis.   Plan:  Patient will go the lab for sedimentation rate, LFTs, SMA, ANA and titer if ANA is positive. Further recommendations to follow.

## 2013-10-02 LAB — ANTI-SMOOTH MUSCLE ANTIBODY, IGG: Smooth Muscle Ab: 16 U (ref <20)

## 2013-10-09 ENCOUNTER — Encounter (INDEPENDENT_AMBULATORY_CARE_PROVIDER_SITE_OTHER): Payer: Medicare Other | Admitting: Ophthalmology

## 2013-10-09 DIAGNOSIS — H35039 Hypertensive retinopathy, unspecified eye: Secondary | ICD-10-CM

## 2013-10-09 DIAGNOSIS — I10 Essential (primary) hypertension: Secondary | ICD-10-CM

## 2013-10-09 DIAGNOSIS — H348392 Tributary (branch) retinal vein occlusion, unspecified eye, stable: Secondary | ICD-10-CM | POA: Diagnosis not present

## 2013-10-09 DIAGNOSIS — H43819 Vitreous degeneration, unspecified eye: Secondary | ICD-10-CM

## 2013-10-13 ENCOUNTER — Telehealth (INDEPENDENT_AMBULATORY_CARE_PROVIDER_SITE_OTHER): Payer: Self-pay | Admitting: *Deleted

## 2013-10-13 DIAGNOSIS — K759 Inflammatory liver disease, unspecified: Secondary | ICD-10-CM

## 2013-10-13 DIAGNOSIS — R748 Abnormal levels of other serum enzymes: Secondary | ICD-10-CM

## 2013-10-13 NOTE — Telephone Encounter (Signed)
Per Dr.[name] the patient will need to have labs drawn in 6 months. 

## 2013-10-20 ENCOUNTER — Encounter (INDEPENDENT_AMBULATORY_CARE_PROVIDER_SITE_OTHER): Payer: Self-pay | Admitting: Internal Medicine

## 2013-11-08 DIAGNOSIS — T23279A Burn of second degree of unspecified wrist, initial encounter: Secondary | ICD-10-CM | POA: Diagnosis not present

## 2013-11-08 DIAGNOSIS — X58XXXA Exposure to other specified factors, initial encounter: Secondary | ICD-10-CM | POA: Diagnosis not present

## 2013-11-10 DIAGNOSIS — T2220XA Burn of second degree of shoulder and upper limb, except wrist and hand, unspecified site, initial encounter: Secondary | ICD-10-CM | POA: Diagnosis not present

## 2013-11-10 DIAGNOSIS — M79609 Pain in unspecified limb: Secondary | ICD-10-CM | POA: Diagnosis not present

## 2013-11-13 ENCOUNTER — Encounter (INDEPENDENT_AMBULATORY_CARE_PROVIDER_SITE_OTHER): Payer: Medicare Other | Admitting: Ophthalmology

## 2013-11-13 DIAGNOSIS — H43819 Vitreous degeneration, unspecified eye: Secondary | ICD-10-CM | POA: Diagnosis not present

## 2013-11-13 DIAGNOSIS — H35039 Hypertensive retinopathy, unspecified eye: Secondary | ICD-10-CM | POA: Diagnosis not present

## 2013-11-13 DIAGNOSIS — H2510 Age-related nuclear cataract, unspecified eye: Secondary | ICD-10-CM

## 2013-11-13 DIAGNOSIS — I10 Essential (primary) hypertension: Secondary | ICD-10-CM | POA: Diagnosis not present

## 2013-11-13 DIAGNOSIS — H348392 Tributary (branch) retinal vein occlusion, unspecified eye, stable: Secondary | ICD-10-CM | POA: Diagnosis not present

## 2013-11-17 DIAGNOSIS — H348392 Tributary (branch) retinal vein occlusion, unspecified eye, stable: Secondary | ICD-10-CM | POA: Diagnosis not present

## 2013-11-17 DIAGNOSIS — H2510 Age-related nuclear cataract, unspecified eye: Secondary | ICD-10-CM | POA: Diagnosis not present

## 2013-11-17 DIAGNOSIS — H25019 Cortical age-related cataract, unspecified eye: Secondary | ICD-10-CM | POA: Diagnosis not present

## 2013-11-17 DIAGNOSIS — H35039 Hypertensive retinopathy, unspecified eye: Secondary | ICD-10-CM | POA: Diagnosis not present

## 2013-12-23 DIAGNOSIS — H2510 Age-related nuclear cataract, unspecified eye: Secondary | ICD-10-CM | POA: Diagnosis not present

## 2014-01-01 ENCOUNTER — Encounter (INDEPENDENT_AMBULATORY_CARE_PROVIDER_SITE_OTHER): Payer: Medicare Other | Admitting: Ophthalmology

## 2014-01-01 DIAGNOSIS — H348392 Tributary (branch) retinal vein occlusion, unspecified eye, stable: Secondary | ICD-10-CM

## 2014-01-01 DIAGNOSIS — H43819 Vitreous degeneration, unspecified eye: Secondary | ICD-10-CM | POA: Diagnosis not present

## 2014-01-01 DIAGNOSIS — H35039 Hypertensive retinopathy, unspecified eye: Secondary | ICD-10-CM | POA: Diagnosis not present

## 2014-01-01 DIAGNOSIS — H2510 Age-related nuclear cataract, unspecified eye: Secondary | ICD-10-CM

## 2014-01-01 DIAGNOSIS — I10 Essential (primary) hypertension: Secondary | ICD-10-CM

## 2014-01-07 DIAGNOSIS — H25019 Cortical age-related cataract, unspecified eye: Secondary | ICD-10-CM | POA: Diagnosis not present

## 2014-01-07 DIAGNOSIS — H2510 Age-related nuclear cataract, unspecified eye: Secondary | ICD-10-CM | POA: Diagnosis not present

## 2014-01-27 DIAGNOSIS — H2510 Age-related nuclear cataract, unspecified eye: Secondary | ICD-10-CM | POA: Diagnosis not present

## 2014-02-26 ENCOUNTER — Encounter (INDEPENDENT_AMBULATORY_CARE_PROVIDER_SITE_OTHER): Payer: Medicare Other | Admitting: Ophthalmology

## 2014-02-26 DIAGNOSIS — H34831 Tributary (branch) retinal vein occlusion, right eye: Secondary | ICD-10-CM

## 2014-02-26 DIAGNOSIS — I10 Essential (primary) hypertension: Secondary | ICD-10-CM | POA: Diagnosis not present

## 2014-02-26 DIAGNOSIS — H35033 Hypertensive retinopathy, bilateral: Secondary | ICD-10-CM | POA: Diagnosis not present

## 2014-02-26 DIAGNOSIS — H43813 Vitreous degeneration, bilateral: Secondary | ICD-10-CM

## 2014-03-18 ENCOUNTER — Other Ambulatory Visit (INDEPENDENT_AMBULATORY_CARE_PROVIDER_SITE_OTHER): Payer: Self-pay | Admitting: *Deleted

## 2014-03-18 ENCOUNTER — Encounter (INDEPENDENT_AMBULATORY_CARE_PROVIDER_SITE_OTHER): Payer: Self-pay | Admitting: *Deleted

## 2014-03-18 DIAGNOSIS — K759 Inflammatory liver disease, unspecified: Secondary | ICD-10-CM

## 2014-03-18 DIAGNOSIS — R748 Abnormal levels of other serum enzymes: Secondary | ICD-10-CM

## 2014-03-26 ENCOUNTER — Encounter (INDEPENDENT_AMBULATORY_CARE_PROVIDER_SITE_OTHER): Payer: Medicare Other | Admitting: Ophthalmology

## 2014-03-26 DIAGNOSIS — H34831 Tributary (branch) retinal vein occlusion, right eye: Secondary | ICD-10-CM | POA: Diagnosis not present

## 2014-03-26 DIAGNOSIS — H35033 Hypertensive retinopathy, bilateral: Secondary | ICD-10-CM | POA: Diagnosis not present

## 2014-03-26 DIAGNOSIS — I10 Essential (primary) hypertension: Secondary | ICD-10-CM

## 2014-03-26 DIAGNOSIS — H43813 Vitreous degeneration, bilateral: Secondary | ICD-10-CM | POA: Diagnosis not present

## 2014-04-22 ENCOUNTER — Encounter (INDEPENDENT_AMBULATORY_CARE_PROVIDER_SITE_OTHER): Payer: Medicare Other | Admitting: Ophthalmology

## 2014-04-22 DIAGNOSIS — H43813 Vitreous degeneration, bilateral: Secondary | ICD-10-CM | POA: Diagnosis not present

## 2014-04-22 DIAGNOSIS — H35033 Hypertensive retinopathy, bilateral: Secondary | ICD-10-CM | POA: Diagnosis not present

## 2014-04-22 DIAGNOSIS — H34831 Tributary (branch) retinal vein occlusion, right eye: Secondary | ICD-10-CM | POA: Diagnosis not present

## 2014-04-22 DIAGNOSIS — I10 Essential (primary) hypertension: Secondary | ICD-10-CM | POA: Diagnosis not present

## 2014-05-27 ENCOUNTER — Encounter (INDEPENDENT_AMBULATORY_CARE_PROVIDER_SITE_OTHER): Payer: Medicare Other | Admitting: Ophthalmology

## 2014-05-27 DIAGNOSIS — I10 Essential (primary) hypertension: Secondary | ICD-10-CM | POA: Diagnosis not present

## 2014-05-27 DIAGNOSIS — H43813 Vitreous degeneration, bilateral: Secondary | ICD-10-CM

## 2014-05-27 DIAGNOSIS — H35033 Hypertensive retinopathy, bilateral: Secondary | ICD-10-CM

## 2014-05-27 DIAGNOSIS — H34831 Tributary (branch) retinal vein occlusion, right eye: Secondary | ICD-10-CM | POA: Diagnosis not present

## 2014-06-08 DIAGNOSIS — H00012 Hordeolum externum right lower eyelid: Secondary | ICD-10-CM | POA: Diagnosis not present

## 2014-07-02 ENCOUNTER — Encounter (INDEPENDENT_AMBULATORY_CARE_PROVIDER_SITE_OTHER): Payer: Medicare Other | Admitting: Ophthalmology

## 2014-07-02 DIAGNOSIS — I10 Essential (primary) hypertension: Secondary | ICD-10-CM

## 2014-07-02 DIAGNOSIS — H43813 Vitreous degeneration, bilateral: Secondary | ICD-10-CM | POA: Diagnosis not present

## 2014-07-02 DIAGNOSIS — H35033 Hypertensive retinopathy, bilateral: Secondary | ICD-10-CM

## 2014-07-02 DIAGNOSIS — H34831 Tributary (branch) retinal vein occlusion, right eye: Secondary | ICD-10-CM | POA: Diagnosis not present

## 2014-07-23 DIAGNOSIS — R410 Disorientation, unspecified: Secondary | ICD-10-CM | POA: Diagnosis not present

## 2014-07-23 DIAGNOSIS — I6523 Occlusion and stenosis of bilateral carotid arteries: Secondary | ICD-10-CM | POA: Diagnosis not present

## 2014-07-23 DIAGNOSIS — R51 Headache: Secondary | ICD-10-CM | POA: Diagnosis not present

## 2014-07-23 DIAGNOSIS — E039 Hypothyroidism, unspecified: Secondary | ICD-10-CM | POA: Diagnosis not present

## 2014-07-23 DIAGNOSIS — Z9181 History of falling: Secondary | ICD-10-CM | POA: Diagnosis not present

## 2014-07-23 DIAGNOSIS — R269 Unspecified abnormalities of gait and mobility: Secondary | ICD-10-CM | POA: Diagnosis not present

## 2014-07-23 DIAGNOSIS — G459 Transient cerebral ischemic attack, unspecified: Secondary | ICD-10-CM | POA: Diagnosis not present

## 2014-07-23 DIAGNOSIS — R7989 Other specified abnormal findings of blood chemistry: Secondary | ICD-10-CM | POA: Diagnosis not present

## 2014-07-23 DIAGNOSIS — R42 Dizziness and giddiness: Secondary | ICD-10-CM | POA: Diagnosis not present

## 2014-07-23 DIAGNOSIS — R4701 Aphasia: Secondary | ICD-10-CM | POA: Diagnosis not present

## 2014-07-24 DIAGNOSIS — R4701 Aphasia: Secondary | ICD-10-CM | POA: Diagnosis not present

## 2014-07-27 DIAGNOSIS — A419 Sepsis, unspecified organism: Secondary | ICD-10-CM | POA: Diagnosis not present

## 2014-07-27 DIAGNOSIS — R509 Fever, unspecified: Secondary | ICD-10-CM | POA: Diagnosis not present

## 2014-07-27 DIAGNOSIS — E039 Hypothyroidism, unspecified: Secondary | ICD-10-CM | POA: Diagnosis not present

## 2014-07-27 DIAGNOSIS — J9601 Acute respiratory failure with hypoxia: Secondary | ICD-10-CM | POA: Diagnosis not present

## 2014-07-27 DIAGNOSIS — F05 Delirium due to known physiological condition: Secondary | ICD-10-CM | POA: Diagnosis not present

## 2014-07-27 DIAGNOSIS — Z8673 Personal history of transient ischemic attack (TIA), and cerebral infarction without residual deficits: Secondary | ICD-10-CM | POA: Diagnosis not present

## 2014-07-27 DIAGNOSIS — R410 Disorientation, unspecified: Secondary | ICD-10-CM | POA: Diagnosis not present

## 2014-07-27 DIAGNOSIS — R4182 Altered mental status, unspecified: Secondary | ICD-10-CM | POA: Diagnosis not present

## 2014-07-27 DIAGNOSIS — J189 Pneumonia, unspecified organism: Secondary | ICD-10-CM | POA: Diagnosis not present

## 2014-07-28 DIAGNOSIS — Z79899 Other long term (current) drug therapy: Secondary | ICD-10-CM | POA: Diagnosis not present

## 2014-07-28 DIAGNOSIS — J189 Pneumonia, unspecified organism: Secondary | ICD-10-CM | POA: Diagnosis present

## 2014-07-28 DIAGNOSIS — F17200 Nicotine dependence, unspecified, uncomplicated: Secondary | ICD-10-CM | POA: Diagnosis present

## 2014-07-28 DIAGNOSIS — R392 Extrarenal uremia: Secondary | ICD-10-CM | POA: Diagnosis present

## 2014-07-28 DIAGNOSIS — Z888 Allergy status to other drugs, medicaments and biological substances status: Secondary | ICD-10-CM | POA: Diagnosis not present

## 2014-07-28 DIAGNOSIS — Z8673 Personal history of transient ischemic attack (TIA), and cerebral infarction without residual deficits: Secondary | ICD-10-CM | POA: Diagnosis not present

## 2014-07-28 DIAGNOSIS — J9601 Acute respiratory failure with hypoxia: Secondary | ICD-10-CM | POA: Diagnosis present

## 2014-07-28 DIAGNOSIS — E039 Hypothyroidism, unspecified: Secondary | ICD-10-CM | POA: Diagnosis present

## 2014-07-28 DIAGNOSIS — A419 Sepsis, unspecified organism: Secondary | ICD-10-CM | POA: Diagnosis present

## 2014-07-28 DIAGNOSIS — F05 Delirium due to known physiological condition: Secondary | ICD-10-CM | POA: Diagnosis not present

## 2014-08-06 ENCOUNTER — Encounter (INDEPENDENT_AMBULATORY_CARE_PROVIDER_SITE_OTHER): Payer: Medicare Other | Admitting: Ophthalmology

## 2014-08-13 ENCOUNTER — Encounter (INDEPENDENT_AMBULATORY_CARE_PROVIDER_SITE_OTHER): Payer: Medicare Other | Admitting: Ophthalmology

## 2014-08-13 DIAGNOSIS — I10 Essential (primary) hypertension: Secondary | ICD-10-CM | POA: Diagnosis not present

## 2014-08-13 DIAGNOSIS — H35033 Hypertensive retinopathy, bilateral: Secondary | ICD-10-CM | POA: Diagnosis not present

## 2014-08-13 DIAGNOSIS — H34831 Tributary (branch) retinal vein occlusion, right eye: Secondary | ICD-10-CM | POA: Diagnosis not present

## 2014-08-13 DIAGNOSIS — H43813 Vitreous degeneration, bilateral: Secondary | ICD-10-CM

## 2014-08-28 DIAGNOSIS — N183 Chronic kidney disease, stage 3 (moderate): Secondary | ICD-10-CM | POA: Diagnosis not present

## 2014-08-28 DIAGNOSIS — J189 Pneumonia, unspecified organism: Secondary | ICD-10-CM | POA: Diagnosis not present

## 2014-08-28 DIAGNOSIS — M545 Low back pain: Secondary | ICD-10-CM | POA: Diagnosis not present

## 2014-08-28 DIAGNOSIS — G459 Transient cerebral ischemic attack, unspecified: Secondary | ICD-10-CM | POA: Diagnosis not present

## 2014-08-28 DIAGNOSIS — R740 Nonspecific elevation of levels of transaminase and lactic acid dehydrogenase [LDH]: Secondary | ICD-10-CM | POA: Diagnosis not present

## 2014-09-02 DIAGNOSIS — G459 Transient cerebral ischemic attack, unspecified: Secondary | ICD-10-CM | POA: Diagnosis not present

## 2014-09-02 DIAGNOSIS — E039 Hypothyroidism, unspecified: Secondary | ICD-10-CM | POA: Diagnosis not present

## 2014-09-02 DIAGNOSIS — Z720 Tobacco use: Secondary | ICD-10-CM | POA: Diagnosis not present

## 2014-09-02 DIAGNOSIS — J189 Pneumonia, unspecified organism: Secondary | ICD-10-CM | POA: Diagnosis not present

## 2014-09-02 DIAGNOSIS — N183 Chronic kidney disease, stage 3 (moderate): Secondary | ICD-10-CM | POA: Diagnosis not present

## 2014-09-02 DIAGNOSIS — R740 Nonspecific elevation of levels of transaminase and lactic acid dehydrogenase [LDH]: Secondary | ICD-10-CM | POA: Diagnosis not present

## 2014-09-07 DIAGNOSIS — E039 Hypothyroidism, unspecified: Secondary | ICD-10-CM | POA: Diagnosis not present

## 2014-09-07 DIAGNOSIS — N183 Chronic kidney disease, stage 3 (moderate): Secondary | ICD-10-CM | POA: Diagnosis not present

## 2014-09-07 DIAGNOSIS — Z1389 Encounter for screening for other disorder: Secondary | ICD-10-CM | POA: Diagnosis not present

## 2014-09-07 DIAGNOSIS — R740 Nonspecific elevation of levels of transaminase and lactic acid dehydrogenase [LDH]: Secondary | ICD-10-CM | POA: Diagnosis not present

## 2014-09-07 DIAGNOSIS — E782 Mixed hyperlipidemia: Secondary | ICD-10-CM | POA: Diagnosis not present

## 2014-09-24 ENCOUNTER — Encounter (INDEPENDENT_AMBULATORY_CARE_PROVIDER_SITE_OTHER): Payer: Medicare Other | Admitting: Ophthalmology

## 2014-10-21 DIAGNOSIS — R188 Other ascites: Secondary | ICD-10-CM | POA: Diagnosis not present

## 2014-10-21 DIAGNOSIS — E039 Hypothyroidism, unspecified: Secondary | ICD-10-CM | POA: Diagnosis not present

## 2014-10-21 DIAGNOSIS — E782 Mixed hyperlipidemia: Secondary | ICD-10-CM | POA: Diagnosis not present

## 2014-10-21 DIAGNOSIS — R42 Dizziness and giddiness: Secondary | ICD-10-CM | POA: Diagnosis not present

## 2014-11-03 DIAGNOSIS — K760 Fatty (change of) liver, not elsewhere classified: Secondary | ICD-10-CM | POA: Diagnosis not present

## 2014-11-03 DIAGNOSIS — R7989 Other specified abnormal findings of blood chemistry: Secondary | ICD-10-CM | POA: Diagnosis not present

## 2014-11-03 DIAGNOSIS — R188 Other ascites: Secondary | ICD-10-CM | POA: Diagnosis not present

## 2014-11-03 DIAGNOSIS — R160 Hepatomegaly, not elsewhere classified: Secondary | ICD-10-CM | POA: Diagnosis not present

## 2014-11-03 DIAGNOSIS — N281 Cyst of kidney, acquired: Secondary | ICD-10-CM | POA: Diagnosis not present

## 2014-11-17 DIAGNOSIS — N183 Chronic kidney disease, stage 3 (moderate): Secondary | ICD-10-CM | POA: Diagnosis not present

## 2014-11-17 DIAGNOSIS — D7589 Other specified diseases of blood and blood-forming organs: Secondary | ICD-10-CM | POA: Diagnosis not present

## 2014-11-17 DIAGNOSIS — R252 Cramp and spasm: Secondary | ICD-10-CM | POA: Diagnosis not present

## 2014-11-17 DIAGNOSIS — D519 Vitamin B12 deficiency anemia, unspecified: Secondary | ICD-10-CM | POA: Diagnosis not present

## 2014-11-17 DIAGNOSIS — R740 Nonspecific elevation of levels of transaminase and lactic acid dehydrogenase [LDH]: Secondary | ICD-10-CM | POA: Diagnosis not present

## 2014-11-17 DIAGNOSIS — E782 Mixed hyperlipidemia: Secondary | ICD-10-CM | POA: Diagnosis not present

## 2014-11-17 DIAGNOSIS — E039 Hypothyroidism, unspecified: Secondary | ICD-10-CM | POA: Diagnosis not present

## 2014-11-23 DIAGNOSIS — R252 Cramp and spasm: Secondary | ICD-10-CM | POA: Diagnosis not present

## 2014-11-23 DIAGNOSIS — E039 Hypothyroidism, unspecified: Secondary | ICD-10-CM | POA: Diagnosis not present

## 2014-11-23 DIAGNOSIS — N183 Chronic kidney disease, stage 3 (moderate): Secondary | ICD-10-CM | POA: Diagnosis not present

## 2014-11-23 DIAGNOSIS — R740 Nonspecific elevation of levels of transaminase and lactic acid dehydrogenase [LDH]: Secondary | ICD-10-CM | POA: Diagnosis not present

## 2014-11-23 DIAGNOSIS — D7589 Other specified diseases of blood and blood-forming organs: Secondary | ICD-10-CM | POA: Diagnosis not present

## 2014-11-23 DIAGNOSIS — E782 Mixed hyperlipidemia: Secondary | ICD-10-CM | POA: Diagnosis not present

## 2014-12-03 DIAGNOSIS — E539 Vitamin B deficiency, unspecified: Secondary | ICD-10-CM | POA: Diagnosis not present

## 2014-12-10 DIAGNOSIS — E539 Vitamin B deficiency, unspecified: Secondary | ICD-10-CM | POA: Diagnosis not present

## 2014-12-17 DIAGNOSIS — E539 Vitamin B deficiency, unspecified: Secondary | ICD-10-CM | POA: Diagnosis not present

## 2014-12-28 DIAGNOSIS — M7022 Olecranon bursitis, left elbow: Secondary | ICD-10-CM | POA: Diagnosis not present

## 2014-12-28 DIAGNOSIS — E039 Hypothyroidism, unspecified: Secondary | ICD-10-CM | POA: Diagnosis not present

## 2014-12-28 DIAGNOSIS — R4189 Other symptoms and signs involving cognitive functions and awareness: Secondary | ICD-10-CM | POA: Diagnosis not present

## 2014-12-31 DIAGNOSIS — M7022 Olecranon bursitis, left elbow: Secondary | ICD-10-CM | POA: Diagnosis not present

## 2014-12-31 DIAGNOSIS — M7021 Olecranon bursitis, right elbow: Secondary | ICD-10-CM | POA: Diagnosis not present

## 2015-01-04 DIAGNOSIS — M7021 Olecranon bursitis, right elbow: Secondary | ICD-10-CM | POA: Diagnosis not present

## 2015-01-07 DIAGNOSIS — M7021 Olecranon bursitis, right elbow: Secondary | ICD-10-CM | POA: Diagnosis not present

## 2015-01-07 DIAGNOSIS — E039 Hypothyroidism, unspecified: Secondary | ICD-10-CM | POA: Diagnosis not present

## 2015-01-19 ENCOUNTER — Ambulatory Visit (INDEPENDENT_AMBULATORY_CARE_PROVIDER_SITE_OTHER): Payer: Medicare Other | Admitting: Neurology

## 2015-01-19 ENCOUNTER — Encounter: Payer: Self-pay | Admitting: Neurology

## 2015-01-19 VITALS — BP 106/64 | HR 68 | Resp 18 | Ht 68.0 in | Wt 205.4 lb

## 2015-01-19 DIAGNOSIS — F0391 Unspecified dementia with behavioral disturbance: Secondary | ICD-10-CM

## 2015-01-19 DIAGNOSIS — F0390 Unspecified dementia without behavioral disturbance: Secondary | ICD-10-CM | POA: Insufficient documentation

## 2015-01-19 DIAGNOSIS — E038 Other specified hypothyroidism: Secondary | ICD-10-CM

## 2015-01-19 DIAGNOSIS — E039 Hypothyroidism, unspecified: Secondary | ICD-10-CM | POA: Insufficient documentation

## 2015-01-19 DIAGNOSIS — R0683 Snoring: Secondary | ICD-10-CM

## 2015-01-19 DIAGNOSIS — G4752 REM sleep behavior disorder: Secondary | ICD-10-CM | POA: Diagnosis not present

## 2015-01-19 DIAGNOSIS — G4719 Other hypersomnia: Secondary | ICD-10-CM

## 2015-01-19 DIAGNOSIS — G308 Other Alzheimer's disease: Secondary | ICD-10-CM | POA: Diagnosis not present

## 2015-01-19 MED ORDER — LORAZEPAM 0.5 MG PO TABS: 0.5000 mg | ORAL_TABLET | Freq: Every day | ORAL | Status: DC
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
## 2015-01-22 LAB — RPR: RPR: NONREACTIVE

## 2015-01-22 LAB — COMPREHENSIVE METABOLIC PANEL
A/G RATIO: 1.3 (ref 1.1–2.5)
ALK PHOS: 70 IU/L (ref 39–117)
ALT: 42 IU/L (ref 0–44)
AST: 125 IU/L — AB (ref 0–40)
Albumin: 4.1 g/dL (ref 3.5–4.8)
BUN/Creatinine Ratio: 16 (ref 10–22)
BUN: 16 mg/dL (ref 8–27)
Bilirubin Total: 0.3 mg/dL (ref 0.0–1.2)
CO2: 22 mmol/L (ref 18–29)
Calcium: 8.9 mg/dL (ref 8.6–10.2)
Chloride: 100 mmol/L (ref 97–108)
Creatinine, Ser: 1.03 mg/dL (ref 0.76–1.27)
GFR calc Af Amer: 82 mL/min/{1.73_m2} (ref >59)
GFR calc non Af Amer: 71 mL/min/{1.73_m2} (ref >59)
GLOBULIN, TOTAL: 3.1 g/dL (ref 1.5–4.5)
Glucose: 88 mg/dL (ref 65–99)
POTASSIUM: 4.2 mmol/L (ref 3.5–5.2)
SODIUM: 139 mmol/L (ref 134–144)
Total Protein: 7.2 g/dL (ref 6.0–8.5)

## 2015-01-22 LAB — ENA+DNA/DS+SJORGEN'S
DSDNA AB: 2 [IU]/mL (ref 0–9)
ENA SM Ab Ser-aCnc: <0.2 AI (ref 0.0–0.9)
ENA SSA (RO) Ab: >8 AI — ABNORMAL HIGH (ref 0.0–0.9)
ENA SSB (LA) Ab: <0.2 AI (ref 0.0–0.9)

## 2015-01-22 LAB — VITAMIN B12: VITAMIN B 12: 257 pg/mL (ref 211–946)

## 2015-01-22 LAB — VITAMIN B1: THIAMINE: 122.3 nmol/L (ref 66.5–200.0)

## 2015-01-22 LAB — ANA W/REFLEX: ANA: POSITIVE — AB

## 2015-01-22 LAB — METHYLMALONIC ACID, SERUM: METHYLMALONIC ACID: 363 nmol/L (ref 0–378)

## 2015-01-22 LAB — SEDIMENTATION RATE: Sed Rate: 27 mm/hr (ref 0–30)

## 2015-01-25 ENCOUNTER — Telehealth: Payer: Self-pay | Admitting: *Deleted

## 2015-01-25 DIAGNOSIS — R768 Other specified abnormal immunological findings in serum: Secondary | ICD-10-CM

## 2015-01-25 DIAGNOSIS — E539 Vitamin B deficiency, unspecified: Secondary | ICD-10-CM | POA: Diagnosis not present

## 2015-01-25 NOTE — Telephone Encounter (Signed)
-----   Message from Britt Bottom, MD sent at 01/22/2015  1:00 PM EDT ----- Please let him know the one lab was abnormal and he could have Sjogren's syndrome I would like him to see a rheumatologist (he can also come back in and see me)

## 2015-01-25 NOTE — Telephone Encounter (Signed)
I have spoken with Jonathon Grimes and Jonathon Grimes both and advised that per RAS, due to positive ANA, he would like Jonathon Grimes to be further eval for Sjogren's syndrome.  I have explained that Sjogren's is an autoimmune dz. that affects mucous membranes, often causing dry eyes, dry mouth.  I have explained that having a positive ANA does not mean Jonathon Grimes definitely has Sjogren's, just that he should be further r/o for this.  They have  verbalized understanding of same and are agreeable to referral.  Referral has been faxed to [facility]/fim

## 2015-02-03 DIAGNOSIS — R768 Other specified abnormal immunological findings in serum: Secondary | ICD-10-CM | POA: Diagnosis not present

## 2015-02-03 DIAGNOSIS — J61 Pneumoconiosis due to asbestos and other mineral fibers: Secondary | ICD-10-CM | POA: Diagnosis not present

## 2015-02-03 DIAGNOSIS — F99 Mental disorder, not otherwise specified: Secondary | ICD-10-CM | POA: Diagnosis not present

## 2015-02-08 DIAGNOSIS — R079 Chest pain, unspecified: Secondary | ICD-10-CM | POA: Diagnosis not present

## 2015-02-08 DIAGNOSIS — R05 Cough: Secondary | ICD-10-CM | POA: Diagnosis not present

## 2015-02-08 DIAGNOSIS — J40 Bronchitis, not specified as acute or chronic: Secondary | ICD-10-CM | POA: Diagnosis not present

## 2015-02-08 DIAGNOSIS — Z8701 Personal history of pneumonia (recurrent): Secondary | ICD-10-CM | POA: Diagnosis not present

## 2015-02-08 DIAGNOSIS — Z79899 Other long term (current) drug therapy: Secondary | ICD-10-CM | POA: Diagnosis not present

## 2015-02-08 DIAGNOSIS — Z87891 Personal history of nicotine dependence: Secondary | ICD-10-CM | POA: Diagnosis not present

## 2015-02-18 DIAGNOSIS — E039 Hypothyroidism, unspecified: Secondary | ICD-10-CM | POA: Diagnosis not present

## 2015-02-25 DIAGNOSIS — B354 Tinea corporis: Secondary | ICD-10-CM | POA: Diagnosis not present

## 2015-02-25 DIAGNOSIS — E539 Vitamin B deficiency, unspecified: Secondary | ICD-10-CM | POA: Diagnosis not present

## 2015-03-17 DIAGNOSIS — E039 Hypothyroidism, unspecified: Secondary | ICD-10-CM | POA: Diagnosis not present

## 2015-03-17 DIAGNOSIS — R740 Nonspecific elevation of levels of transaminase and lactic acid dehydrogenase [LDH]: Secondary | ICD-10-CM | POA: Diagnosis not present

## 2015-03-17 DIAGNOSIS — D7589 Other specified diseases of blood and blood-forming organs: Secondary | ICD-10-CM | POA: Diagnosis not present

## 2015-03-17 DIAGNOSIS — E782 Mixed hyperlipidemia: Secondary | ICD-10-CM | POA: Diagnosis not present

## 2015-03-17 DIAGNOSIS — Z720 Tobacco use: Secondary | ICD-10-CM | POA: Diagnosis not present

## 2015-03-17 DIAGNOSIS — N183 Chronic kidney disease, stage 3 (moderate): Secondary | ICD-10-CM | POA: Diagnosis not present

## 2015-03-24 ENCOUNTER — Ambulatory Visit (INDEPENDENT_AMBULATORY_CARE_PROVIDER_SITE_OTHER): Payer: Medicare Other | Admitting: Neurology

## 2015-03-24 ENCOUNTER — Encounter: Payer: Self-pay | Admitting: Neurology

## 2015-03-24 VITALS — BP 132/70 | HR 58 | Resp 18 | Ht 68.0 in | Wt 205.8 lb

## 2015-03-24 DIAGNOSIS — G4719 Other hypersomnia: Secondary | ICD-10-CM | POA: Diagnosis not present

## 2015-03-24 DIAGNOSIS — R7989 Other specified abnormal findings of blood chemistry: Secondary | ICD-10-CM | POA: Diagnosis not present

## 2015-03-24 DIAGNOSIS — F0391 Unspecified dementia with behavioral disturbance: Secondary | ICD-10-CM | POA: Diagnosis not present

## 2015-03-24 DIAGNOSIS — G4752 REM sleep behavior disorder: Secondary | ICD-10-CM | POA: Diagnosis not present

## 2015-03-24 DIAGNOSIS — R0683 Snoring: Secondary | ICD-10-CM | POA: Diagnosis not present

## 2015-03-24 MED ORDER — DIAZEPAM 5 MG PO TABS: 5.0000 mg | ORAL_TABLET | Freq: Every day | ORAL | Status: DC

## 2015-03-24 NOTE — Progress Notes (Signed)
[facility]  PATIENT: Jonathon Grimes DOB: 01/22/1941  REFERRING DOCTOR OR PCP:  Consuello Masse  SOURCE: patient  _________________________________   HISTORICAL  CHIEF COMPLAINT:  Chief Complaint  Patient presents with  . Memory Loss    He denies further episodes of altered awareness.  ANA was positive--sts. he did f/u with [facility] for r/o Sjogren's and was told he did not need to come back, that sx. likely due to vit. b deficiency.  Sts. Dr. Quintin Alto (pcp) has also told him sx. may be due to vit. b def.  He is getting vit. b12 inj. at pcp's office and sts. he feels better after getting an inj.  Sts. he had mri brain done in [place]--they are unable to recall the name of the [facility].  Sts. they scheduled his sleep   . Sleep Disturbance    study but had an emergency and were not able to come.  They do plan on r/s sleep study.  At last ov, Lorazepam was added for sleep, but he never got rx. filled/fim    HISTORY OF PRESENT ILLNESS:  Jonathon Grimes is a 74 yo man with memory changes with behavioral changes.     According to his girlfriend, the memory is slightly better but sleep issues persist.     Memory difficulty:    He has had memory and language difficulty - he often says words that are not in sentences or with the wrong words.   He  also been noted to have altered personality.   His ability to do math is only mildly worse.    I personally reviewed the MRI of the brain. He has atrophy that is just slightly more pronounced in the mesial temporal lobes. Additionally, there is small vessel ischemic changes. There were no acute findings.   Labs showed elevated SSA/Ro and he saw rheumatology who felt Sjogren's was unlikely.   B12 continues to be supplemented.      Mood has seemed a lot worse (very critical and short tempered).      OSA?/RBD:  Since February he has also had difficulty with talking and screaming and kicking and grabbing.   He has fallen out  of bed a few times or dog barking or girlfriend nudging him will wake him up.   He doesn't know who she is until he wakes up.     He goes to bed at 830-9 pm and gets up at 4 am.     He snores and has sleepiness.   He has never been evaluated for sleep apnea.   He canceled the Split night PSG.       He has no problems with gait or strength or sensation.       EPWORTH SLEEPINESS SCALE  On a scale of 0 - 3 what is the chance of dozing:  Sitting and Reading:   3 Watching TV:    3 Sitting inactive in a public place: 1 Passenger in car for one hour: 2 Lying down to rest in the afternoon: 3 Sitting and talking to someone: 2  Sitting quietly after lunch:  3 In a car, stopped in traffic:  0  Total (out of 24):    17/24     REVIEW OF SYSTEMS: Constitutional: No fevers, chills, sweats, or change in appetite.   He talks and yells at night.    Eyes: No visual changes, double vision, eye pain Ear, nose and throat: No hearing loss, ear pain,  nasal congestion, sore throat Cardiovascular: No chest pain, palpitations Respiratory: No shortness of breath at rest or with exertion.   No wheezes GastrointestinaI: No nausea, vomiting, diarrhea, abdominal pain, fecal incontinence Genitourinary: No dysuria, urinary retention or frequency.  No nocturia. Musculoskeletal: No neck pain, back pain Integumentary: No rash, pruritus, skin lesions Neurological: as above Psychiatric: He is very irritable and sometimes mean spirited.    No anxiety Endocrine: No palpitations, diaphoresis, change in appetite, change in weigh or increased thirst Hematologic/Lymphatic: No anemia, purpura, petechiae. Allergic/Immunologic: No itchy/runny eyes, nasal congestion, recent allergic reactions, rashes  ALLERGIES: Allergies  Allergen Reactions  . Celebrex [Celecoxib] Other (See Comments)    Unknown    HOME MEDICATIONS:  Current outpatient prescriptions:  .  aspirin 81 MG chewable tablet, Chew 81 mg by mouth  daily., Disp: , Rfl:  .  levothyroxine (SYNTHROID, LEVOTHROID) 100 MCG tablet, Take 100 mcg by mouth daily before breakfast., Disp: , Rfl:  .  vitamin B-12 (CYANOCOBALAMIN) 1000 MCG tablet, Take 1,000 mcg by mouth daily., Disp: , Rfl:  .  LORazepam (ATIVAN) 0.5 MG tablet, Take 1 tablet (0.5 mg total) by mouth at bedtime. (Patient not taking: Reported on 03/24/2015), Disp: 30 tablet, Rfl: 0  PAST MEDICAL HISTORY: Past Medical History  Diagnosis Date  . Abdominal pain, generalized   . Nonspecific elevation of levels of transaminase or lactic acid dehydrogenase (LDH)   . Open wound of finger(s) , without mention of complication   . Heartburn   . Early satiety   . History of colonoscopy     2008 Dr. Anthony Sar and was normal.  . Asbestosis(501)     diagnosed in 2001    PAST SURGICAL HISTORY: Past Surgical History  Procedure Laterality Date  . Cholecystectomy      1994 [facility]: gallstones  . Knee cartilage surgery      Bilateral  . Esophagogastroduodenoscopy  03/30/2011    Procedure: ESOPHAGOGASTRODUODENOSCOPY (EGD);  Surgeon: Rogene Houston, MD;  [facility];  Service: Endoscopy;  Laterality: N/A;  . Eye surgery  April 2015    Ruptured vessel in the back of your right eye    FAMILY HISTORY: History reviewed. No pertinent family history.  SOCIAL HISTORY:  Social History   Social History  . Marital Status: Widowed    Spouse Name: N/A  . Number of Children: N/A  . Years of Education: N/A   Occupational History  . Not on file.   Social History Main Topics  . Smoking status: Former Smoker    Start date: 10/01/2011  . Smokeless tobacco: Never Used     Comment: 1 pack a day  . Alcohol Use: No  . Drug Use: No  . Sexual Activity: Not on file   Other Topics Concern  . Not on file   Social History Narrative     PHYSICAL EXAM  Filed Vitals:   03/24/15 0956  BP: 132/70  Pulse: 58  Resp: 18  Height: 5\' 8"  (1.727 m)  Weight: 205 lb 12.8 oz (93.35 kg)     Body mass index is 31.3 kg/(m^2).   General: The patient is well-developed and well-nourished and in no acute distress   Neurologic Exam  Mental status: Oriented to person, place, partial date (02/23/2015).  Reduced attention (100-93-84-7?; WORLD-DLORD),, reduced STM (1/3 without prompt - 2/3 with prompt)    Speech has rare paraphrasic errors.   .  Cranial nerves: Extraocular movements are full. Facial symmetry is present. There is good facial  sensation to soft touch bilaterally.Facial strength is normal.  Trapezius and sternocleidomastoid strength is normal. No dysarthria is noted.  The tongue is midline, and the patient has symmetric elevation of the soft palate. No obvious hearing deficits are noted.  Motor:  Muscle bulk is normal.   Tone is normal. Strength is  5 / 5 in all 4 extremities.   Sensory: Sensory testing is intact to  touch and vibration sensation in all 4 extremities.  Coordination: Cerebellar testing reveals good finger-nose-finger and heel-to-shin bilaterally.  Gait:    Station is normal.    Gait is normal  and tandem is moderately wide.    Romberg is negative.    No retropulsion.  Reflexes: Deep tendon reflexes are symmetric and normal bilaterally.   Plantar responses are flexor.    DIAGNOSTIC DATA (LABS, IMAGING, TESTING) - I reviewed patient records, labs, notes, testing and imaging myself where available.  Lab Results  Component Value Date   WBC 4.2 02/08/2011   HGB 14.0 02/08/2011   HCT 42.9 02/08/2011   MCV 100.2* 02/08/2011   PLT 207 02/08/2011      Component Value Date/Time   NA 139 01/19/2015 1436   NA 140 09/24/2012 1505   K 4.2 01/19/2015 1436   CL 100 01/19/2015 1436   CO2 22 01/19/2015 1436   GLUCOSE 88 01/19/2015 1436   GLUCOSE 71 09/24/2012 1505   BUN 16 01/19/2015 1436   BUN 15 09/24/2012 1505   CREATININE 1.03 01/19/2015 1436   CREATININE 1.09 09/24/2012 1505   CALCIUM 8.9 01/19/2015 1436   PROT 7.2 01/19/2015 1436   PROT 7.7  09/30/2013 1504   ALBUMIN 4.1 01/19/2015 1436   ALBUMIN 4.5 09/30/2013 1504   AST 125* 01/19/2015 1436   ALT 42 01/19/2015 1436   ALKPHOS 70 01/19/2015 1436   BILITOT 0.3 01/19/2015 1436   BILITOT 0.5 09/30/2013 1504   GFRNONAA 71 01/19/2015 1436   GFRAA 82 01/19/2015 1436       ASSESSMENT AND PLAN  Dementia, with behavioral disturbance  REM sleep behavior disorder - Plan: Split night study  Excessive daytime sleepiness - Plan: Split night study  Snoring - Plan: Split night study  Low serum vitamin D   1.   He has an unusual dementia that does not appear to be Alzheimer's. I do not believe the B12 was low enough to explain his symptoms.  He does have some atrophy on imaging.  I am concerned that at least part of his memory difficulties could be due to sleep apnea as he has snoring and excessive daytime sleepiness a split-night was re-ordered. 2.   He has REM behavior disorder and  snoring with excessive daytime sleepiness.  I will order a split-night study to better evaluate this. Valium 5 mg (try 1/2 the first couple nights) will be added for the REM behavior disorder but they should hold if his cognitive changes worsen.  He will return to see me in 3 months or sooner if there are new or worsening neurologic symptoms.    Richard A. Felecia Shelling, MD, PhD 45/07/8880, 80:03 AM Certified in Neurology, Clinical Neurophysiology, Sleep Medicine, Pain Medicine and Neuroimaging  [facility] [address] [place], [place] [address] [phone]

## 2015-03-29 DIAGNOSIS — E8881 Metabolic syndrome: Secondary | ICD-10-CM | POA: Diagnosis not present

## 2015-03-29 DIAGNOSIS — R7301 Impaired fasting glucose: Secondary | ICD-10-CM | POA: Diagnosis not present

## 2015-03-29 DIAGNOSIS — E039 Hypothyroidism, unspecified: Secondary | ICD-10-CM | POA: Diagnosis not present

## 2015-03-29 DIAGNOSIS — E782 Mixed hyperlipidemia: Secondary | ICD-10-CM | POA: Diagnosis not present

## 2015-03-29 DIAGNOSIS — R740 Nonspecific elevation of levels of transaminase and lactic acid dehydrogenase [LDH]: Secondary | ICD-10-CM | POA: Diagnosis not present

## 2015-03-29 DIAGNOSIS — Z23 Encounter for immunization: Secondary | ICD-10-CM | POA: Diagnosis not present

## 2015-03-29 DIAGNOSIS — N183 Chronic kidney disease, stage 3 (moderate): Secondary | ICD-10-CM | POA: Diagnosis not present

## 2015-04-08 ENCOUNTER — Telehealth: Payer: Self-pay | Admitting: *Deleted

## 2015-04-08 NOTE — Telephone Encounter (Signed)
Significant other Ruby request a call due to her worries of Mr Tarrel unusual behavior since Saturday.  You can reach her at [phone]

## 2015-04-11 ENCOUNTER — Ambulatory Visit (INDEPENDENT_AMBULATORY_CARE_PROVIDER_SITE_OTHER): Payer: Medicare Other | Admitting: Neurology

## 2015-04-11 DIAGNOSIS — R0683 Snoring: Secondary | ICD-10-CM

## 2015-04-11 DIAGNOSIS — G4719 Other hypersomnia: Secondary | ICD-10-CM

## 2015-04-11 DIAGNOSIS — G4733 Obstructive sleep apnea (adult) (pediatric): Secondary | ICD-10-CM

## 2015-04-11 DIAGNOSIS — F0391 Unspecified dementia with behavioral disturbance: Secondary | ICD-10-CM

## 2015-04-11 DIAGNOSIS — G4752 REM sleep behavior disorder: Secondary | ICD-10-CM

## 2015-04-12 NOTE — Sleep Study (Signed)
Please see the scanned sleep study interpretation located in the Procedure tab within the Chart Review section. 

## 2015-04-27 ENCOUNTER — Ambulatory Visit: Payer: Medicare Other | Admitting: Neurology

## 2015-04-28 ENCOUNTER — Encounter: Payer: Self-pay | Admitting: Neurology

## 2015-04-28 ENCOUNTER — Ambulatory Visit (INDEPENDENT_AMBULATORY_CARE_PROVIDER_SITE_OTHER): Payer: Medicare Other | Admitting: Neurology

## 2015-04-28 VITALS — BP 124/76 | HR 60 | Resp 16 | Ht 68.0 in | Wt 209.8 lb

## 2015-04-28 DIAGNOSIS — G4719 Other hypersomnia: Secondary | ICD-10-CM

## 2015-04-28 DIAGNOSIS — F0391 Unspecified dementia with behavioral disturbance: Secondary | ICD-10-CM | POA: Diagnosis not present

## 2015-04-28 DIAGNOSIS — G4752 REM sleep behavior disorder: Secondary | ICD-10-CM

## 2015-04-28 DIAGNOSIS — G4733 Obstructive sleep apnea (adult) (pediatric): Secondary | ICD-10-CM | POA: Diagnosis not present

## 2015-04-28 MED ORDER — DONEPEZIL HCL 10 MG PO TABS: 10.0000 mg | ORAL_TABLET | Freq: Every day | ORAL | Status: DC

## 2015-04-28 NOTE — Progress Notes (Signed)
[facility]  PATIENT: Jonathon Grimes DOB: 1940/10/21  REFERRING DOCTOR OR PCP:  Consuello Masse  SOURCE: patient  _________________________________   HISTORICAL  CHIEF COMPLAINT:  Chief Complaint  Patient presents with  . Dementia    Gay sts. he is doing ok since last ov.  Denies further episodes of altered awareness.  Sig. other, Ruby, sts. he has remained oriented since last ov, but sometimes loses his train of thought when speaking--doesn't finish his sentences, and has had one episode where he had difficulty dressing himself.  He had a S/N study on 04-11-15, moderate osa was dx., but no optimal cpap pressure was identified, so Dr. Felecia Shelling rec. autopap 14-21, but pt. declined, stating he would not be compliant with CPAP/fim  . Sleep Apnea    HISTORY OF PRESENT ILLNESS:  Jonathon Grimes is a 74 yo man with memory changes with behavioral changes.   His girlfriend believes that he has been stable over the past 6 months and better than he was in February and March.   Memory difficulty:   He has had memory and cognitive changes since last year.   He has had memory and language difficulty - he often says words that are not in sentences or with the wrong words.   He  also been noted to have altered personality.   His ability to do math is only mildly worse.  In February he did especially poorly and was admitted to [facility].      I re-reviewed the MRI of the brain performed February. It shows moderate atrophy that is just slightly more pronounced in the mesial temporal lobes. Additionally, there is small vessel ischemic changes. There were no acute findings.   Labs showed elevated SSA/Ro and he saw rheumatology who felt Sjogren's was unlikely.   B12 continues to be supplemented (shots).        Mood:   Mood is poor at times (very critical and short tempered).    His girlfriend is concerned about strokes as his changes  OSA/RBD:      He snores and has sleepiness.   He  has moderate sleep apnea (severe during REM).   He was titrated but adequate pressure not identified during the split night.     He did not like wearing the mask and does not want to use CPAP at home.    He has very poor dentition and is not an oral appliance candidate.     RBD:   Since February he has also had difficulty with talking and screaming and kicking and grabbing.   He fell out of bed a few times or dog barking or girlfriend nudging him will wake him up.   He doesn't know who she is until he wakes up.     He goes to bed at 830-9 pm and gets up at 4 am.    1/2 pill valium has helped a lot and he has no severe active dreams  He has no problems with gait or strength or sensation.       EPWORTH SLEEPINESS SCALE  On a scale of 0 - 3 what is the chance of dozing:  Sitting and Reading:   3 Watching TV:    3 Sitting inactive in a public place: 1 Passenger in car for one hour: 2 Lying down to rest in the afternoon: 3 Sitting and talking to someone: 1  Sitting quietly after lunch:  3 In a car, stopped in traffic:  0  Total (out of 24):    16/24     REVIEW OF SYSTEMS: Constitutional: No fevers, chills, sweats, or change in appetite.   He talks and yells at night.    Eyes: No visual changes, double vision, eye pain Ear, nose and throat: No hearing loss, ear pain, nasal congestion, sore throat Cardiovascular: No chest pain, palpitations Respiratory: No shortness of breath at rest or with exertion.   No wheezes GastrointestinaI: No nausea, vomiting, diarrhea, abdominal pain, fecal incontinence Genitourinary: No dysuria, urinary retention or frequency.  No nocturia. Musculoskeletal: No neck pain, back pain Integumentary: No rash, pruritus, skin lesions Neurological: as above Psychiatric: He is very irritable and sometimes mean spirited.    No anxiety Endocrine: No palpitations, diaphoresis, change in appetite, change in weigh or increased thirst Hematologic/Lymphatic: No anemia,  purpura, petechiae. Allergic/Immunologic: No itchy/runny eyes, nasal congestion, recent allergic reactions, rashes  ALLERGIES: Allergies  Allergen Reactions  . Celebrex [Celecoxib] Other (See Comments)    Unknown    HOME MEDICATIONS:  Current outpatient prescriptions:  .  aspirin 81 MG chewable tablet, Chew 81 mg by mouth daily., Disp: , Rfl:  .  diazepam (VALIUM) 5 MG tablet, Take 1 tablet (5 mg total) by mouth at bedtime., Disp: 30 tablet, Rfl: 1 .  levothyroxine (SYNTHROID, LEVOTHROID) 100 MCG tablet, Take 100 mcg by mouth daily before breakfast., Disp: , Rfl:  .  vitamin B-12 (CYANOCOBALAMIN) 1000 MCG tablet, Take 1,000 mcg by mouth daily., Disp: , Rfl:   PAST MEDICAL HISTORY: Past Medical History  Diagnosis Date  . Abdominal pain, generalized   . Nonspecific elevation of levels of transaminase or lactic acid dehydrogenase (LDH)   . Open wound of finger(s) , without mention of complication   . Heartburn   . Early satiety   . History of colonoscopy     2008 Dr. Anthony Sar and was normal.  . Asbestosis(501)     diagnosed in 2001    PAST SURGICAL HISTORY: Past Surgical History  Procedure Laterality Date  . Cholecystectomy      1994 [facility]: gallstones  . Knee cartilage surgery      Bilateral  . Esophagogastroduodenoscopy  03/30/2011    Procedure: ESOPHAGOGASTRODUODENOSCOPY (EGD);  Surgeon: Rogene Houston, MD;  [facility];  Service: Endoscopy;  Laterality: N/A;  . Eye surgery  April 2015    Ruptured vessel in the back of your right eye    FAMILY HISTORY: History reviewed. No pertinent family history.  SOCIAL HISTORY:  Social History   Social History  . Marital Status: Widowed    Spouse Name: N/A  . Number of Children: N/A  . Years of Education: N/A   Occupational History  . Not on file.   Social History Main Topics  . Smoking status: Former Smoker    Start date: 10/01/2011  . Smokeless tobacco: Never Used     Comment: 1 pack a day  . Alcohol  Use: No  . Drug Use: No  . Sexual Activity: Not on file   Other Topics Concern  . Not on file   Social History Narrative     PHYSICAL EXAM  Filed Vitals:   04/28/15 1052  BP: 124/76  Pulse: 60  Resp: 16  Height: 5\' 8"  (1.727 m)  Weight: 209 lb 12.8 oz (95.165 kg)    Body mass index is 31.91 kg/(m^2).   General: The patient is well-developed and well-nourished and in no acute distress   Neurologic Exam  Mental  status: Oriented to person, place, partial date (May 17, 2015).  Reduced attention ([id number]; WORLD-DLORW),, reduced STM (0/3 without prompt - 2/3 with prompt)    Speech has rare paraphrasic errors.   Cranial nerves: Extraocular movements are full. Facial symmetry is present. There is good facial sensation to soft touch bilaterally.Facial strength is normal.  Trapezius and sternocleidomastoid strength is normal. No dysarthria is noted.  The tongue is midline, and the patient has symmetric elevation of the soft palate. No obvious hearing deficits are noted.  Motor:  Muscle bulk is normal.   Tone is normal. Strength is  5 / 5 in all 4 extremities.   Sensory: Sensory testing is intact to  touch and vibration sensation in all 4 extremities.  Coordination: Cerebellar testing reveals good finger-nose-finger and heel-to-shin bilaterally.  Gait:    Station is normal.    Gait is normal  and tandem is moderately wide.    Romberg is negative.    No retropulsion.  Reflexes: Deep tendon reflexes are symmetric and normal bilaterally.       DIAGNOSTIC DATA (LABS, IMAGING, TESTING) - I reviewed patient records, labs, notes, testing and imaging myself where available.  Lab Results  Component Value Date   WBC 4.2 02/08/2011   HGB 14.0 02/08/2011   HCT 42.9 02/08/2011   MCV 100.2* 02/08/2011   PLT 207 02/08/2011      Component Value Date/Time   NA 139 01/19/2015 1436   NA 140 09/24/2012 1505   K 4.2 01/19/2015 1436   CL 100 01/19/2015 1436   CO2 22  01/19/2015 1436   GLUCOSE 88 01/19/2015 1436   GLUCOSE 71 09/24/2012 1505   BUN 16 01/19/2015 1436   BUN 15 09/24/2012 1505   CREATININE 1.03 01/19/2015 1436   CREATININE 1.09 09/24/2012 1505   CALCIUM 8.9 01/19/2015 1436   PROT 7.2 01/19/2015 1436   PROT 7.7 09/30/2013 1504   ALBUMIN 4.1 01/19/2015 1436   ALBUMIN 4.5 09/30/2013 1504   AST 125* 01/19/2015 1436   ALT 42 01/19/2015 1436   ALKPHOS 70 01/19/2015 1436   BILITOT 0.3 01/19/2015 1436   BILITOT 0.5 09/30/2013 1504   GFRNONAA 71 01/19/2015 1436   GFRAA 82 01/19/2015 1436       ASSESSMENT AND PLAN  Dementia, with behavioral disturbance  REM sleep behavior disorder  OSA (obstructive sleep apnea)  Excessive daytime sleepiness    1.   He does not have classic Alzheimer's disease though does appear to have a dementia. I will have him try Aricept. He should continue to get B12 shots.  2.   He has REM behavior disorder and OSA.   He does not want to consider CPAP therapy. Due to poor dentition, he cannot use an oral appliance. I discussed with him and his girlfriend that I would like him to try CPAP and they will get back with me if he wants to reconsider. He can continue low-dose diazepam for his REM behavior disorder. 3.   He will return to see me in 5-6 months or sooner if there are new or worsening neurologic symptoms.    Karna Abed A. Felecia Shelling, MD, PhD [id number], [id number] AM Certified in Neurology, Clinical Neurophysiology, Sleep Medicine, Pain Medicine and Neuroimaging  [facility] [address] [place], [place] [address] [phone]

## 2015-05-12 DIAGNOSIS — E039 Hypothyroidism, unspecified: Secondary | ICD-10-CM | POA: Diagnosis not present

## 2015-06-24 DIAGNOSIS — E539 Vitamin B deficiency, unspecified: Secondary | ICD-10-CM | POA: Diagnosis not present

## 2015-06-24 DIAGNOSIS — E039 Hypothyroidism, unspecified: Secondary | ICD-10-CM | POA: Diagnosis not present

## 2015-07-22 DIAGNOSIS — E8881 Metabolic syndrome: Secondary | ICD-10-CM | POA: Diagnosis not present

## 2015-07-22 DIAGNOSIS — E539 Vitamin B deficiency, unspecified: Secondary | ICD-10-CM | POA: Diagnosis not present

## 2015-07-22 DIAGNOSIS — R740 Nonspecific elevation of levels of transaminase and lactic acid dehydrogenase [LDH]: Secondary | ICD-10-CM | POA: Diagnosis not present

## 2015-07-22 DIAGNOSIS — R7301 Impaired fasting glucose: Secondary | ICD-10-CM | POA: Diagnosis not present

## 2015-07-22 DIAGNOSIS — N183 Chronic kidney disease, stage 3 (moderate): Secondary | ICD-10-CM | POA: Diagnosis not present

## 2015-07-22 DIAGNOSIS — E782 Mixed hyperlipidemia: Secondary | ICD-10-CM | POA: Diagnosis not present

## 2015-07-22 DIAGNOSIS — E039 Hypothyroidism, unspecified: Secondary | ICD-10-CM | POA: Diagnosis not present

## 2015-07-28 DIAGNOSIS — H35033 Hypertensive retinopathy, bilateral: Secondary | ICD-10-CM | POA: Diagnosis not present

## 2015-08-20 DIAGNOSIS — E539 Vitamin B deficiency, unspecified: Secondary | ICD-10-CM | POA: Diagnosis not present

## 2015-10-21 DIAGNOSIS — E8881 Metabolic syndrome: Secondary | ICD-10-CM | POA: Diagnosis not present

## 2015-10-21 DIAGNOSIS — N183 Chronic kidney disease, stage 3 (moderate): Secondary | ICD-10-CM | POA: Diagnosis not present

## 2015-10-21 DIAGNOSIS — E782 Mixed hyperlipidemia: Secondary | ICD-10-CM | POA: Diagnosis not present

## 2015-10-21 DIAGNOSIS — D519 Vitamin B12 deficiency anemia, unspecified: Secondary | ICD-10-CM | POA: Diagnosis not present

## 2015-10-21 DIAGNOSIS — E539 Vitamin B deficiency, unspecified: Secondary | ICD-10-CM | POA: Diagnosis not present

## 2015-10-21 DIAGNOSIS — R7301 Impaired fasting glucose: Secondary | ICD-10-CM | POA: Diagnosis not present

## 2015-10-21 DIAGNOSIS — E039 Hypothyroidism, unspecified: Secondary | ICD-10-CM | POA: Diagnosis not present

## 2015-10-26 DIAGNOSIS — E539 Vitamin B deficiency, unspecified: Secondary | ICD-10-CM | POA: Diagnosis not present

## 2015-10-26 DIAGNOSIS — N183 Chronic kidney disease, stage 3 (moderate): Secondary | ICD-10-CM | POA: Diagnosis not present

## 2015-10-26 DIAGNOSIS — E782 Mixed hyperlipidemia: Secondary | ICD-10-CM | POA: Diagnosis not present

## 2015-10-26 DIAGNOSIS — E8881 Metabolic syndrome: Secondary | ICD-10-CM | POA: Diagnosis not present

## 2015-10-26 DIAGNOSIS — R188 Other ascites: Secondary | ICD-10-CM | POA: Diagnosis not present

## 2015-10-26 DIAGNOSIS — E039 Hypothyroidism, unspecified: Secondary | ICD-10-CM | POA: Diagnosis not present

## 2015-10-26 DIAGNOSIS — R7301 Impaired fasting glucose: Secondary | ICD-10-CM | POA: Diagnosis not present

## 2015-10-26 DIAGNOSIS — R740 Nonspecific elevation of levels of transaminase and lactic acid dehydrogenase [LDH]: Secondary | ICD-10-CM | POA: Diagnosis not present

## 2015-10-27 ENCOUNTER — Ambulatory Visit: Payer: Medicare Other | Admitting: Neurology

## 2015-10-29 ENCOUNTER — Encounter: Payer: Self-pay | Admitting: Neurology

## 2015-11-05 DIAGNOSIS — R188 Other ascites: Secondary | ICD-10-CM | POA: Diagnosis not present

## 2015-11-18 DIAGNOSIS — E8881 Metabolic syndrome: Secondary | ICD-10-CM | POA: Diagnosis not present

## 2015-11-18 DIAGNOSIS — E039 Hypothyroidism, unspecified: Secondary | ICD-10-CM | POA: Diagnosis not present

## 2015-11-18 DIAGNOSIS — E782 Mixed hyperlipidemia: Secondary | ICD-10-CM | POA: Diagnosis not present

## 2015-11-18 DIAGNOSIS — E539 Vitamin B deficiency, unspecified: Secondary | ICD-10-CM | POA: Diagnosis not present

## 2015-11-18 DIAGNOSIS — N183 Chronic kidney disease, stage 3 (moderate): Secondary | ICD-10-CM | POA: Diagnosis not present

## 2016-01-19 DIAGNOSIS — E539 Vitamin B deficiency, unspecified: Secondary | ICD-10-CM | POA: Diagnosis not present

## 2016-02-02 DIAGNOSIS — R7301 Impaired fasting glucose: Secondary | ICD-10-CM | POA: Diagnosis not present

## 2016-02-02 DIAGNOSIS — E8881 Metabolic syndrome: Secondary | ICD-10-CM | POA: Diagnosis not present

## 2016-02-02 DIAGNOSIS — E539 Vitamin B deficiency, unspecified: Secondary | ICD-10-CM | POA: Diagnosis not present

## 2016-02-02 DIAGNOSIS — E782 Mixed hyperlipidemia: Secondary | ICD-10-CM | POA: Diagnosis not present

## 2016-02-02 DIAGNOSIS — N183 Chronic kidney disease, stage 3 (moderate): Secondary | ICD-10-CM | POA: Diagnosis not present

## 2016-02-02 DIAGNOSIS — E039 Hypothyroidism, unspecified: Secondary | ICD-10-CM | POA: Diagnosis not present

## 2016-02-10 DIAGNOSIS — R531 Weakness: Secondary | ICD-10-CM | POA: Diagnosis not present

## 2016-02-10 DIAGNOSIS — Z87891 Personal history of nicotine dependence: Secondary | ICD-10-CM | POA: Diagnosis not present

## 2016-02-10 DIAGNOSIS — Z79899 Other long term (current) drug therapy: Secondary | ICD-10-CM | POA: Diagnosis not present

## 2016-02-10 DIAGNOSIS — R509 Fever, unspecified: Secondary | ICD-10-CM | POA: Diagnosis not present

## 2016-02-10 DIAGNOSIS — R05 Cough: Secondary | ICD-10-CM | POA: Diagnosis not present

## 2016-03-14 DIAGNOSIS — R740 Nonspecific elevation of levels of transaminase and lactic acid dehydrogenase [LDH]: Secondary | ICD-10-CM | POA: Diagnosis not present

## 2016-03-14 DIAGNOSIS — R7301 Impaired fasting glucose: Secondary | ICD-10-CM | POA: Diagnosis not present

## 2016-03-14 DIAGNOSIS — N183 Chronic kidney disease, stage 3 (moderate): Secondary | ICD-10-CM | POA: Diagnosis not present

## 2016-03-14 DIAGNOSIS — E8881 Metabolic syndrome: Secondary | ICD-10-CM | POA: Diagnosis not present

## 2016-03-14 DIAGNOSIS — E539 Vitamin B deficiency, unspecified: Secondary | ICD-10-CM | POA: Diagnosis not present

## 2016-03-14 DIAGNOSIS — Z23 Encounter for immunization: Secondary | ICD-10-CM | POA: Diagnosis not present

## 2016-03-14 DIAGNOSIS — E782 Mixed hyperlipidemia: Secondary | ICD-10-CM | POA: Diagnosis not present

## 2016-03-14 DIAGNOSIS — E039 Hypothyroidism, unspecified: Secondary | ICD-10-CM | POA: Diagnosis not present

## 2016-04-17 DIAGNOSIS — S0101XA Laceration without foreign body of scalp, initial encounter: Secondary | ICD-10-CM | POA: Diagnosis not present

## 2016-04-17 DIAGNOSIS — E539 Vitamin B deficiency, unspecified: Secondary | ICD-10-CM | POA: Diagnosis not present

## 2016-07-17 DIAGNOSIS — J069 Acute upper respiratory infection, unspecified: Secondary | ICD-10-CM | POA: Diagnosis not present

## 2016-07-17 DIAGNOSIS — R0602 Shortness of breath: Secondary | ICD-10-CM | POA: Diagnosis not present

## 2016-07-17 DIAGNOSIS — Z87891 Personal history of nicotine dependence: Secondary | ICD-10-CM | POA: Diagnosis not present

## 2016-07-17 DIAGNOSIS — R079 Chest pain, unspecified: Secondary | ICD-10-CM | POA: Diagnosis not present

## 2016-07-17 DIAGNOSIS — Z7982 Long term (current) use of aspirin: Secondary | ICD-10-CM | POA: Diagnosis not present

## 2016-07-17 DIAGNOSIS — J90 Pleural effusion, not elsewhere classified: Secondary | ICD-10-CM | POA: Diagnosis not present

## 2016-07-17 DIAGNOSIS — J9811 Atelectasis: Secondary | ICD-10-CM | POA: Diagnosis not present

## 2016-08-01 DIAGNOSIS — E539 Vitamin B deficiency, unspecified: Secondary | ICD-10-CM | POA: Diagnosis not present

## 2016-10-17 DIAGNOSIS — R296 Repeated falls: Secondary | ICD-10-CM | POA: Diagnosis not present

## 2016-10-17 DIAGNOSIS — R413 Other amnesia: Secondary | ICD-10-CM | POA: Diagnosis not present

## 2016-11-13 ENCOUNTER — Encounter: Payer: Self-pay | Admitting: Neurology

## 2016-11-13 ENCOUNTER — Encounter (INDEPENDENT_AMBULATORY_CARE_PROVIDER_SITE_OTHER): Payer: Self-pay

## 2016-11-13 ENCOUNTER — Ambulatory Visit (INDEPENDENT_AMBULATORY_CARE_PROVIDER_SITE_OTHER): Payer: Medicare Other | Admitting: Neurology

## 2016-11-13 VITALS — BP 120/72 | HR 54 | Resp 18 | Ht 68.0 in | Wt 195.0 lb

## 2016-11-13 DIAGNOSIS — R7989 Other specified abnormal findings of blood chemistry: Secondary | ICD-10-CM | POA: Diagnosis not present

## 2016-11-13 DIAGNOSIS — G4719 Other hypersomnia: Secondary | ICD-10-CM | POA: Diagnosis not present

## 2016-11-13 DIAGNOSIS — G4733 Obstructive sleep apnea (adult) (pediatric): Secondary | ICD-10-CM | POA: Diagnosis not present

## 2016-11-13 DIAGNOSIS — E038 Other specified hypothyroidism: Secondary | ICD-10-CM | POA: Diagnosis not present

## 2016-11-13 DIAGNOSIS — G4752 REM sleep behavior disorder: Secondary | ICD-10-CM | POA: Diagnosis not present

## 2016-11-13 DIAGNOSIS — F0391 Unspecified dementia with behavioral disturbance: Secondary | ICD-10-CM

## 2016-11-13 DIAGNOSIS — E538 Deficiency of other specified B group vitamins: Secondary | ICD-10-CM | POA: Diagnosis not present

## 2016-11-13 DIAGNOSIS — R2689 Other abnormalities of gait and mobility: Secondary | ICD-10-CM | POA: Diagnosis not present

## 2016-11-13 NOTE — Progress Notes (Signed)
[facility]  PATIENT: Jonathon Grimes DOB: [birth year]  REFERRING DOCTOR OR PCP:  Consuello Masse  SOURCE: patient  _________________________________   HISTORICAL  CHIEF COMPLAINT:  Chief Complaint  Patient presents with  . Memory Loss    Sts. memory is some worse. Sts. he stopped Donepezil after one month/fim    HISTORY OF PRESENT ILLNESS:  Jonathon Grimes is a 76 yo man with memory changes with behavioral changes.     His girlfriend believes that he is having more trouble with his memory.   He also has confusion that fluctuates a lot being much worse some days.    She notes that his personality is a little different and he gets frustrated easily.    He continues to have some problems with language, especially coming up with the right words.   History of Memory difficulty:   He has had memory and cognitive changes since 2015.   He has had the most problems with memory and with verbal fluency. He  also been noted to have altered personality.  In February 2016, he was doing much worse and was admitted to [facility]. The MRI showed moderate atrophy that was more pronounced in the mesial temporal lobes. Additionally, there is small vessel ischemic changes. There were no acute findings.   Labs showed elevated SSA/Ro and he saw rheumatology who felt Sjogren's was unlikely.  In the past, he had B12 shots but none recently.      Mood:   He has had more irritability.  At times he is short tempered..    His girlfriend is concerned about strokes as his changes  OSA/RBD:      He snores and has sleepiness.   He has moderate sleep apnea (severe during REM).   He was titrated but adequate pressure not identified during the split night.     He did not like wearing the mask and does not want to use CPAP at home.    He has very poor dentition and is not an oral appliance candidate.     RBD:   He has active dreams still.   He jumped out of bed and fell a couple weeks ago.    He has   Some flailing lasting a few minutes some nights.    He does not scream most nights but sometimes has.    He goes to bed at 9-10 pm and gets up at 4 am.   At the last visit his girlfriend said that 1/2 pill valium at night helped but now she says it did not help and he no longer takes it.     Asbestosis:   He sees Dr. Pearlie Oyster.   He has a lot of coughing.     He is not having any problems with gait, strength or sensation or bladder.       REVIEW OF SYSTEMS: Constitutional: No fevers, chills, sweats, or change in appetite.   He talks and yells at night.    Eyes: No visual changes, double vision, eye pain Ear, nose and throat: No hearing loss, ear pain, nasal congestion, sore throat Cardiovascular: No chest pain, palpitations Respiratory: No shortness of breath at rest or with exertion.   No wheezes GastrointestinaI: No nausea, vomiting, diarrhea, abdominal pain, fecal incontinence Genitourinary: No dysuria, urinary retention or frequency.  No nocturia. Musculoskeletal: No neck pain, back pain Integumentary: No rash, pruritus, skin lesions Neurological: as above Psychiatric: He is very irritable and sometimes mean spirited.  No anxiety Endocrine: No palpitations, diaphoresis, change in appetite, change in weigh or increased thirst Hematologic/Lymphatic: No anemia, purpura, petechiae. Allergic/Immunologic: No itchy/runny eyes, nasal congestion, recent allergic reactions, rashes  ALLERGIES: Allergies  Allergen Reactions  . Celebrex [Celecoxib] Other (See Comments)    Unknown    HOME MEDICATIONS:  Current Outpatient Prescriptions:  .  aspirin 81 MG chewable tablet, Chew 81 mg by mouth daily., Disp: , Rfl:  .  diazepam (VALIUM) 5 MG tablet, Take 1 tablet (5 mg total) by mouth at bedtime., Disp: 30 tablet, Rfl: 1 .  donepezil (ARICEPT) 10 MG tablet, Take 1 tablet (10 mg total) by mouth at bedtime., Disp: 30 tablet, Rfl: 5 .  levothyroxine (SYNTHROID, LEVOTHROID) 100 MCG tablet,  Take 100 mcg by mouth daily before breakfast., Disp: , Rfl:  .  vitamin B-12 (CYANOCOBALAMIN) 1000 MCG tablet, Take 1,000 mcg by mouth daily., Disp: , Rfl:   PAST MEDICAL HISTORY: Past Medical History:  Diagnosis Date  . Abdominal pain, generalized   . Asbestosis(501)    diagnosed in 2001  . Early satiety   . Heartburn   . History of colonoscopy    2008 Dr. Anthony Sar and was normal.  . Nonspecific elevation of levels of transaminase or lactic acid dehydrogenase (LDH)   . Open wound of finger(s) , without mention of complication     PAST SURGICAL HISTORY: Past Surgical History:  Procedure Laterality Date  . CHOLECYSTECTOMY     1994 [facility]: gallstones  . ESOPHAGOGASTRODUODENOSCOPY  03/30/2011   Procedure: ESOPHAGOGASTRODUODENOSCOPY (EGD);  Surgeon: Rogene Houston, MD;  Location: [facility];  Service: Endoscopy;  Laterality: N/A;  . EYE SURGERY  April 2015   Ruptured vessel in the back of your right eye  . KNEE CARTILAGE SURGERY     Bilateral    FAMILY HISTORY: No family history on file.  SOCIAL HISTORY:  Social History   Social History  . Marital status: Widowed    Spouse name: N/A  . Number of children: N/A  . Years of education: N/A   Occupational History  . Not on file.   Social History Main Topics  . Smoking status: Former Smoker    Start date: 10/01/2011  . Smokeless tobacco: Never Used     Comment: 1 pack a day  . Alcohol use No  . Drug use: No  . Sexual activity: Not on file   Other Topics Concern  . Not on file   Social History Narrative  . No narrative on file     PHYSICAL EXAM  Vitals:   11/13/16 0937  BP: 120/72  Pulse: (!) 54  Resp: 18  Weight: 195 lb (88.5 kg)  Height: 5\' 8"  (1.727 m)    Body mass index is 29.65 kg/m.   General: The patient is well-developed and well-nourished and in no acute distress   Neurologic Exam  Mental status: Oriented to person, place,  date.  Reduced attention (100-93-85-?; WORLD-DLORW),, reduced  STM (1/3 without prompt - 2/3 with prompt)    Speech has occasionalparaphrasic errors.   Cranial nerves: Extraocular movements are full. Facial strength and sensation is normal. Trapezius and sternocleidomastoid strength is normal.  No dysarthria is noted.  The tongue is midline, and the patient has symmetric elevation of the soft palate. No obvious hearing deficits are noted.  Motor:  Muscle bulk is normal.   Tone is normal. Strength is  5 / 5 in all 4 extremities.   Sensory: He has normal sensory to  touch and vibration in the arms and legs..  Coordination: He has good finger-nose-finger and heel-to-shin bilaterally.  Gait:    Station is normal.   His gait is normal but the tandem walk is wide. Romberg sign is negative. There is no retropulsion.  Reflexes: Deep tendon reflexes are symmetric and normal bilaterally.       DIAGNOSTIC DATA (LABS, IMAGING, TESTING) - I reviewed patient records, labs, notes, testing and imaging myself where available.  Lab Results  Component Value Date   WBC 4.2 02/08/2011   HGB 14.0 02/08/2011   HCT 42.9 02/08/2011   MCV 100.2 (H) 02/08/2011   PLT 207 02/08/2011      Component Value Date/Time   NA 139 01/19/2015 1436   K 4.2 01/19/2015 1436   CL 100 01/19/2015 1436   CO2 22 01/19/2015 1436   GLUCOSE 88 01/19/2015 1436   GLUCOSE 71 09/24/2012 1505   BUN 16 01/19/2015 1436   CREATININE 1.03 01/19/2015 1436   CREATININE 1.09 09/24/2012 1505   CALCIUM 8.9 01/19/2015 1436   PROT 7.2 01/19/2015 1436   ALBUMIN 4.1 01/19/2015 1436   AST 125 (H) 01/19/2015 1436   ALT 42 01/19/2015 1436   ALKPHOS 70 01/19/2015 1436   BILITOT 0.3 01/19/2015 1436   GFRNONAA 71 01/19/2015 1436   GFRAA 82 01/19/2015 1436       ASSESSMENT AND PLAN  Dementia with behavioral disturbance, unspecified dementia type - Plan: Vitamin B12, TSH, Sedimentation rate, MR BRAIN WO CONTRAST, Vitamin B1, VITAMIN D 25 Hydroxy (Vit-D Deficiency, Fractures)  Excessive daytime  sleepiness  OSA (obstructive sleep apnea)  REM sleep behavior disorder  Low serum vitamin D - Plan: VITAMIN D 25 Hydroxy (Vit-D Deficiency, Fractures)  B12 deficiency - Plan: Vitamin B12  Other specified hypothyroidism - Plan: TSH, T4  Balance problems     1.   He has a dementia that does not neatly fall into a category. There are some features consistent with Alzheimer's. She did not get any benefit from donepezil and it was discontinued.  Because it has been some continued progression we will check some more blood work today and also check an MRI to determine if any treatable lesion or other changes over the past couple of years..  2.   His Girlfriend now states that the Valium did not help the REM behavior disorder. He also has obstructive sleep apnea but does not want to use CPAP. Due to poor dentition an, oral appliance cannot really be used.   If the REM behavior disorder worsens, consider a retrial of a benzodiazepine. 3.   He will return to see me in 5-6 months or sooner if there are new or worsening neurologic symptoms.    Richard A. Felecia Shelling, MD, PhD 2/58/5277, 8:24 AM Certified in Neurology, Clinical Neurophysiology, Sleep Medicine, Pain Medicine and Neuroimaging  [facility] [address] [place], [place] [address] [phone]

## 2016-11-15 ENCOUNTER — Telehealth: Payer: Self-pay | Admitting: *Deleted

## 2016-11-15 LAB — VITAMIN B1: THIAMINE: 85.6 nmol/L (ref 66.5–200.0)

## 2016-11-15 LAB — SEDIMENTATION RATE: SED RATE: 12 mm/h (ref 0–30)

## 2016-11-15 LAB — VITAMIN B12: Vitamin B-12: 249 pg/mL (ref 232–1245)

## 2016-11-15 LAB — TSH: TSH: 8.8 u[IU]/mL — ABNORMAL HIGH (ref 0.450–4.500)

## 2016-11-15 LAB — VITAMIN D 25 HYDROXY (VIT D DEFICIENCY, FRACTURES): Vit D, 25-Hydroxy: 25.8 ng/mL — ABNORMAL LOW (ref 30.0–100.0)

## 2016-11-15 LAB — T4: T4 TOTAL: 5.7 ug/dL (ref 4.5–12.0)

## 2016-11-15 MED ORDER — VITAMIN D (ERGOCALCIFEROL) 1.25 MG (50000 UNIT) PO CAPS: 50000.0000 [IU] | ORAL_CAPSULE | ORAL | 0 refills | Status: DC

## 2016-11-15 NOTE — Telephone Encounter (Signed)
Pt's wife called back, the cell phone died, she did not want to miss the RN's call. I told the RN had not called yet, she was thankful, cell phone is charging now

## 2016-11-15 NOTE — Telephone Encounter (Signed)
-----   Message from Britt Bottom, MD sent at 11/15/2016  9:58 AM EDT ----- Vitamin B12 was borderline low. If she still doing the B12 shots. If not, he needs to do these on a monthly basis.  Vitamin D is a little low. We can do 50,000 units weekly 12 weeks then he can take 2000 units daily OTC  The thyroid test was borderline hypothyroidism (TSH elevated but T4 ok)..  I don't treat thyroid disorders. Please ask who his current PCP is so we can make sure that they have access to his labs.

## 2016-11-15 NOTE — Addendum Note (Signed)
Addended by: France Ravens I on: 11/15/2016 05:35 PM   Modules accepted: Orders

## 2016-11-15 NOTE — Telephone Encounter (Signed)
Ruby called office returning RN's call.  Please call

## 2016-11-15 NOTE — Telephone Encounter (Signed)
I have attempted to contact Jonathon Grimes with pt's lab results. VM not set up at her #/fim

## 2016-11-15 NOTE — Telephone Encounter (Signed)
I have spoken with Ruby. Vit. D rx. faxed to [place] per request.  Pt. is no longer getting Vit. B12 shots, but will resume with pcp.  Lab results from 6/18, including thyroid labs, faxed to [place], fax# [phone], per Ruby's request/fim

## 2016-11-21 ENCOUNTER — Telehealth: Payer: Self-pay | Admitting: *Deleted

## 2016-11-21 NOTE — Telephone Encounter (Signed)
-----   Message from Britt Bottom, MD sent at 11/15/2016  9:58 AM EDT ----- Vitamin B12 was borderline low. If she still doing the B12 shots. If not, he needs to do these on a monthly basis.  Vitamin D is a little low. We can do 50,000 units weekly 12 weeks then he can take 2000 units daily OTC  The thyroid test was borderline hypothyroidism (TSH elevated but T4 ok)..  I don't treat thyroid disorders. Please ask who his current PCP is so we can make sure that they have access to his labs.

## 2016-11-21 NOTE — Telephone Encounter (Signed)
Results addressed in separate telephone note/fim

## 2016-11-24 ENCOUNTER — Ambulatory Visit
Admission: RE | Admit: 2016-11-24 | Discharge: 2016-11-24 | Disposition: A | Discharge status: Home / Self Care | Payer: Medicare Other | Source: Ambulatory Visit | Attending: Neurology | Admitting: Neurology

## 2016-11-24 DIAGNOSIS — F0390 Unspecified dementia without behavioral disturbance: Secondary | ICD-10-CM | POA: Diagnosis not present

## 2016-11-24 DIAGNOSIS — F0391 Unspecified dementia with behavioral disturbance: Secondary | ICD-10-CM | POA: Diagnosis not present

## 2016-11-27 ENCOUNTER — Telehealth: Payer: Self-pay | Admitting: *Deleted

## 2016-11-27 NOTE — Telephone Encounter (Signed)
-----   Message from Britt Bottom, MD sent at 11/27/2016  4:13 PM EDT ----- Please let them know that the MRI of the brain does show some atrophy, more than expected for age.    Otherwise the MRI was normal for age.

## 2016-11-27 NOTE — Telephone Encounter (Signed)
LMTC./fim 

## 2016-11-28 NOTE — Telephone Encounter (Signed)
LMTC./fim 

## 2016-12-15 DIAGNOSIS — E538 Deficiency of other specified B group vitamins: Secondary | ICD-10-CM | POA: Diagnosis not present

## 2016-12-15 DIAGNOSIS — R413 Other amnesia: Secondary | ICD-10-CM | POA: Diagnosis not present

## 2016-12-15 DIAGNOSIS — S80861A Insect bite (nonvenomous), right lower leg, initial encounter: Secondary | ICD-10-CM | POA: Diagnosis not present

## 2016-12-15 DIAGNOSIS — S80862A Insect bite (nonvenomous), left lower leg, initial encounter: Secondary | ICD-10-CM | POA: Diagnosis not present

## 2016-12-15 DIAGNOSIS — W57XXXA Bitten or stung by nonvenomous insect and other nonvenomous arthropods, initial encounter: Secondary | ICD-10-CM | POA: Diagnosis not present

## 2016-12-15 DIAGNOSIS — E039 Hypothyroidism, unspecified: Secondary | ICD-10-CM | POA: Diagnosis not present

## 2016-12-15 DIAGNOSIS — E559 Vitamin D deficiency, unspecified: Secondary | ICD-10-CM | POA: Diagnosis not present

## 2017-02-13 DIAGNOSIS — Z77090 Contact with and (suspected) exposure to asbestos: Secondary | ICD-10-CM | POA: Diagnosis not present

## 2017-02-13 DIAGNOSIS — E039 Hypothyroidism, unspecified: Secondary | ICD-10-CM | POA: Diagnosis not present

## 2017-02-13 DIAGNOSIS — E559 Vitamin D deficiency, unspecified: Secondary | ICD-10-CM | POA: Diagnosis not present

## 2017-02-13 DIAGNOSIS — R413 Other amnesia: Secondary | ICD-10-CM | POA: Diagnosis not present

## 2017-02-13 DIAGNOSIS — E538 Deficiency of other specified B group vitamins: Secondary | ICD-10-CM | POA: Diagnosis not present

## 2017-03-19 DIAGNOSIS — E538 Deficiency of other specified B group vitamins: Secondary | ICD-10-CM | POA: Diagnosis not present

## 2017-04-16 DIAGNOSIS — E538 Deficiency of other specified B group vitamins: Secondary | ICD-10-CM | POA: Diagnosis not present

## 2017-04-16 DIAGNOSIS — J61 Pneumoconiosis due to asbestos and other mineral fibers: Secondary | ICD-10-CM | POA: Diagnosis not present

## 2017-04-16 DIAGNOSIS — R413 Other amnesia: Secondary | ICD-10-CM | POA: Diagnosis not present

## 2017-04-16 DIAGNOSIS — E039 Hypothyroidism, unspecified: Secondary | ICD-10-CM | POA: Diagnosis not present

## 2017-06-08 DIAGNOSIS — E538 Deficiency of other specified B group vitamins: Secondary | ICD-10-CM | POA: Diagnosis not present

## 2017-08-01 ENCOUNTER — Inpatient Hospital Stay (HOSPITAL_COMMUNITY)
Admission: EM | Admit: 2017-08-01 | Discharge: 2017-08-06 | DRG: 071 | Disposition: A | Discharge status: Skilled Nursing Facility | Payer: Medicare Other | Attending: Internal Medicine | Admitting: Internal Medicine

## 2017-08-01 ENCOUNTER — Other Ambulatory Visit: Payer: Self-pay

## 2017-08-01 ENCOUNTER — Encounter (HOSPITAL_COMMUNITY): Payer: Self-pay

## 2017-08-01 ENCOUNTER — Emergency Department (HOSPITAL_COMMUNITY): Payer: Medicare Other

## 2017-08-01 DIAGNOSIS — J90 Pleural effusion, not elsewhere classified: Secondary | ICD-10-CM | POA: Diagnosis present

## 2017-08-01 DIAGNOSIS — R4182 Altered mental status, unspecified: Secondary | ICD-10-CM | POA: Diagnosis not present

## 2017-08-01 DIAGNOSIS — I10 Essential (primary) hypertension: Secondary | ICD-10-CM | POA: Diagnosis present

## 2017-08-01 DIAGNOSIS — R740 Nonspecific elevation of levels of transaminase and lactic acid dehydrogenase [LDH]: Secondary | ICD-10-CM | POA: Diagnosis present

## 2017-08-01 DIAGNOSIS — D72829 Elevated white blood cell count, unspecified: Secondary | ICD-10-CM | POA: Diagnosis present

## 2017-08-01 DIAGNOSIS — R6510 Systemic inflammatory response syndrome (SIRS) of non-infectious origin without acute organ dysfunction: Secondary | ICD-10-CM | POA: Diagnosis not present

## 2017-08-01 DIAGNOSIS — F0391 Unspecified dementia with behavioral disturbance: Secondary | ICD-10-CM | POA: Diagnosis not present

## 2017-08-01 DIAGNOSIS — Z7982 Long term (current) use of aspirin: Secondary | ICD-10-CM

## 2017-08-01 DIAGNOSIS — W109XXA Fall (on) (from) unspecified stairs and steps, initial encounter: Secondary | ICD-10-CM | POA: Diagnosis present

## 2017-08-01 DIAGNOSIS — G4733 Obstructive sleep apnea (adult) (pediatric): Secondary | ICD-10-CM | POA: Diagnosis not present

## 2017-08-01 DIAGNOSIS — R7401 Elevation of levels of liver transaminase levels: Secondary | ICD-10-CM | POA: Diagnosis present

## 2017-08-01 DIAGNOSIS — F0390 Unspecified dementia without behavioral disturbance: Secondary | ICD-10-CM | POA: Diagnosis present

## 2017-08-01 DIAGNOSIS — G9341 Metabolic encephalopathy: Secondary | ICD-10-CM | POA: Diagnosis not present

## 2017-08-01 DIAGNOSIS — Y9301 Activity, walking, marching and hiking: Secondary | ICD-10-CM | POA: Diagnosis present

## 2017-08-01 DIAGNOSIS — R1310 Dysphagia, unspecified: Secondary | ICD-10-CM | POA: Diagnosis present

## 2017-08-01 DIAGNOSIS — Z79899 Other long term (current) drug therapy: Secondary | ICD-10-CM

## 2017-08-01 DIAGNOSIS — W19XXXA Unspecified fall, initial encounter: Secondary | ICD-10-CM | POA: Diagnosis present

## 2017-08-01 DIAGNOSIS — S022XXA Fracture of nasal bones, initial encounter for closed fracture: Secondary | ICD-10-CM | POA: Diagnosis present

## 2017-08-01 DIAGNOSIS — K219 Gastro-esophageal reflux disease without esophagitis: Secondary | ICD-10-CM | POA: Diagnosis present

## 2017-08-01 DIAGNOSIS — R296 Repeated falls: Secondary | ICD-10-CM

## 2017-08-01 DIAGNOSIS — J61 Pneumoconiosis due to asbestos and other mineral fibers: Secondary | ICD-10-CM | POA: Diagnosis present

## 2017-08-01 DIAGNOSIS — R05 Cough: Secondary | ICD-10-CM

## 2017-08-01 DIAGNOSIS — R509 Fever, unspecified: Secondary | ICD-10-CM

## 2017-08-01 DIAGNOSIS — E039 Hypothyroidism, unspecified: Secondary | ICD-10-CM | POA: Diagnosis present

## 2017-08-01 DIAGNOSIS — Z87891 Personal history of nicotine dependence: Secondary | ICD-10-CM

## 2017-08-01 DIAGNOSIS — S4992XA Unspecified injury of left shoulder and upper arm, initial encounter: Secondary | ICD-10-CM | POA: Diagnosis not present

## 2017-08-01 DIAGNOSIS — M25512 Pain in left shoulder: Secondary | ICD-10-CM | POA: Diagnosis not present

## 2017-08-01 DIAGNOSIS — R410 Disorientation, unspecified: Secondary | ICD-10-CM

## 2017-08-01 DIAGNOSIS — R059 Cough, unspecified: Secondary | ICD-10-CM

## 2017-08-01 HISTORY — DX: Hypothyroidism, unspecified: E03.9

## 2017-08-01 HISTORY — DX: Gastro-esophageal reflux disease without esophagitis: K21.9

## 2017-08-01 HISTORY — DX: Essential (primary) hypertension: I10

## 2017-08-01 LAB — CBC
HCT: 40.1 % (ref 39.0–52.0)
Hemoglobin: 13.4 g/dL (ref 13.0–17.0)
MCH: 33.2 pg (ref 26.0–34.0)
MCHC: 33.4 g/dL (ref 30.0–36.0)
MCV: 99.3 fL (ref 78.0–100.0)
PLATELETS: 201 10*3/uL (ref 150–400)
RBC: 4.04 MIL/uL — ABNORMAL LOW (ref 4.22–5.81)
RDW: 13.9 % (ref 11.5–15.5)
WBC: 12.9 10*3/uL — ABNORMAL HIGH (ref 4.0–10.5)

## 2017-08-01 LAB — URINALYSIS, ROUTINE W REFLEX MICROSCOPIC
BILIRUBIN URINE: NEGATIVE
GLUCOSE, UA: NEGATIVE mg/dL
HGB URINE DIPSTICK: NEGATIVE
Ketones, ur: 20 mg/dL — AB
LEUKOCYTES UA: NEGATIVE
NITRITE: NEGATIVE
PH: 5 (ref 5.0–8.0)
Protein, ur: 30 mg/dL — AB
SPECIFIC GRAVITY, URINE: 1.027 (ref 1.005–1.030)

## 2017-08-01 LAB — I-STAT TROPONIN, ED: Troponin i, poc: 0.02 ng/mL (ref 0.00–0.08)

## 2017-08-01 LAB — DIFFERENTIAL
BASOS PCT: 0 %
Basophils Absolute: 0 10*3/uL (ref 0.0–0.1)
EOS ABS: 0 10*3/uL (ref 0.0–0.7)
EOS PCT: 0 %
LYMPHS ABS: 1.6 10*3/uL (ref 0.7–4.0)
Lymphocytes Relative: 12 %
Monocytes Absolute: 0.6 10*3/uL (ref 0.1–1.0)
Monocytes Relative: 5 %
NEUTROS PCT: 83 %
Neutro Abs: 10.7 10*3/uL — ABNORMAL HIGH (ref 1.7–7.7)

## 2017-08-01 LAB — COMPREHENSIVE METABOLIC PANEL
ALBUMIN: 4.2 g/dL (ref 3.5–5.0)
ALT: 26 U/L (ref 17–63)
ANION GAP: 10 (ref 5–15)
AST: 127 U/L — ABNORMAL HIGH (ref 15–41)
Alkaline Phosphatase: 60 U/L (ref 38–126)
BILIRUBIN TOTAL: 1.5 mg/dL — AB (ref 0.3–1.2)
BUN: 25 mg/dL — ABNORMAL HIGH (ref 6–20)
CO2: 24 mmol/L (ref 22–32)
Calcium: 9.1 mg/dL (ref 8.9–10.3)
Chloride: 107 mmol/L (ref 101–111)
Creatinine, Ser: 1.23 mg/dL (ref 0.61–1.24)
GFR calc Af Amer: >60 mL/min (ref >60)
GFR calc non Af Amer: 55 mL/min — ABNORMAL LOW (ref >60)
GLUCOSE: 100 mg/dL — AB (ref 65–99)
POTASSIUM: 4.4 mmol/L (ref 3.5–5.1)
SODIUM: 141 mmol/L (ref 135–145)
TOTAL PROTEIN: 8.4 g/dL — AB (ref 6.5–8.1)

## 2017-08-01 LAB — I-STAT CHEM 8, ED
BUN: 29 mg/dL — AB (ref 6–20)
Calcium, Ion: 1.17 mmol/L (ref 1.15–1.40)
Chloride: 106 mmol/L (ref 101–111)
Creatinine, Ser: 1.1 mg/dL (ref 0.61–1.24)
Glucose, Bld: 95 mg/dL (ref 65–99)
HEMATOCRIT: 42 % (ref 39.0–52.0)
HEMOGLOBIN: 14.3 g/dL (ref 13.0–17.0)
Potassium: 4.3 mmol/L (ref 3.5–5.1)
SODIUM: 143 mmol/L (ref 135–145)
TCO2: 25 mmol/L (ref 22–32)

## 2017-08-01 LAB — RAPID URINE DRUG SCREEN, HOSP PERFORMED
Amphetamines: NOT DETECTED
BARBITURATES: NOT DETECTED
Benzodiazepines: NOT DETECTED
Cocaine: NOT DETECTED
Opiates: NOT DETECTED
TETRAHYDROCANNABINOL: NOT DETECTED

## 2017-08-01 LAB — PROTIME-INR
INR: 1.06
PROTHROMBIN TIME: 13.7 s (ref 11.4–15.2)

## 2017-08-01 LAB — APTT: aPTT: 41 seconds — ABNORMAL HIGH (ref 24–36)

## 2017-08-01 LAB — AMMONIA: Ammonia: 25 umol/L (ref 9–35)

## 2017-08-01 LAB — ETHANOL: Alcohol, Ethyl (B): <10 mg/dL (ref <10)

## 2017-08-01 NOTE — ED Notes (Signed)
Patient transported to MRI 

## 2017-08-01 NOTE — ED Provider Notes (Signed)
[facility] Provider Note   CSN: [id number] Arrival date & time: 08/01/17  1108     History   Chief Complaint Chief Complaint  Patient presents with  . Fall  . Gait Problem    HPI [facility] is a 77 y.o. male.  HPI   77 year old male with history of hypothyroidism, GERD, dementia, presenting for evaluation of recurrent falls and gait problem.  Patient is an unreliable historian, history difficult to obtain. History obtained through girlfriend who is at bedside.  Per girlfriend, patient has not been himself for the past week.  He has fallen 4 separate times, 2 of them were witnessed by his girlfriend.  States that he would walk up the steps and lost balance and fell.  He has been acting "not normal and disoriented at times" today she found him very aggressive, and he has soiled himself.  She did reach out to his PCP but the appointment is not till later in the day and girlfriend decided to bring patient to the ER for further evaluation.  While waiting in the ED waiting room, patient want to go away and was missing for period of time.  Staff was able to locate patient and brought him to the room.  Currently patient denies having any active pain.  When probed, his only complaint is left shoulder pain.  Girlfriend states that he may have falling on that shoulder and he have not been using it like usual.  Per girlfriend, about 6 days ago patient was walking up the steps of her house when he fell.  States that he landed on his bottom, and was requiring help to get up.  He fell again 2 days later with walking up steps requiring assistance.  He is more confused than usual.  He occasionally complain of headache.  He does not drink alcohol or use drugs.  When asked if patient has pain, patient denies.  Past Medical History:  Diagnosis Date  . Abdominal pain, generalized   . Asbestosis(501)    diagnosed in 2001  . Early satiety   . Heartburn   . History  of colonoscopy    2008 Dr. Anthony Sar and was normal.  . Nonspecific elevation of levels of transaminase or lactic acid dehydrogenase (LDH)   . Open wound of finger(s) , without mention of complication     Patient Active Problem List   Diagnosis Date Noted  . B12 deficiency 11/13/2016  . Balance problems 11/13/2016  . OSA (obstructive sleep apnea) 04/28/2015  . Low serum vitamin D 03/24/2015  . Dementia 01/19/2015  . REM sleep behavior disorder 01/19/2015  . Hypothyroid 01/19/2015  . Snoring 01/19/2015  . Excessive daytime sleepiness 01/19/2015  . Transaminasemia 03/26/2012  . GERD (gastroesophageal reflux disease) 09/25/2011  . Asbestosis(501) 09/25/2011    Past Surgical History:  Procedure Laterality Date  . CHOLECYSTECTOMY     1994 [place]: gallstones  . ESOPHAGOGASTRODUODENOSCOPY  03/30/2011   Procedure: ESOPHAGOGASTRODUODENOSCOPY (EGD);  Surgeon: Rogene Houston, MD;  Location: [facility];  Service: Endoscopy;  Laterality: N/A;  . EYE SURGERY  April 2015   Ruptured vessel in the back of your right eye  . KNEE CARTILAGE SURGERY     Bilateral       Home Medications    Prior to Admission medications   Medication Sig Start Date End Date Taking? Authorizing Provider  aspirin 81 MG chewable tablet Chew 81 mg by mouth daily.    [provider]  diazepam (VALIUM) 5 MG tablet Take 1 tablet (5 mg total) by mouth at bedtime. 03/24/15   Sater, Nanine Means, MD  donepezil (ARICEPT) 10 MG tablet Take 1 tablet (10 mg total) by mouth at bedtime. 04/28/15   Sater, Nanine Means, MD  levothyroxine (SYNTHROID, LEVOTHROID) 100 MCG tablet Take 100 mcg by mouth daily before breakfast.    [provider]  vitamin B-12 (CYANOCOBALAMIN) 1000 MCG tablet Take 1,000 mcg by mouth daily.    [provider]  Vitamin D, Ergocalciferol, (DRISDOL) 50000 units CAPS capsule Take 1 capsule (50,000 Units total) by mouth every 7 (seven) days. Once done with prescription, take over the  counter Vitamin D 2,000u once daily 11/15/16   Sater, Nanine Means, MD    Family History History reviewed. No pertinent family history.  Social History Social History   Tobacco Use  . Smoking status: Former Smoker    Start date: 10/01/2011  . Smokeless tobacco: Never Used  . Tobacco comment: 1 pack a day  Substance Use Topics  . Alcohol use: No  . Drug use: No     Allergies   Celebrex [celecoxib]   Review of Systems Review of Systems  Unable to perform ROS: Dementia     Physical Exam Updated Vital Signs BP 122/73 (BP Location: Left Arm)   Pulse 69   Temp 98.4 F (36.9 C) (Oral)   Resp 16   Ht 5\' 8"  (1.727 m)   Wt 90.7 kg (200 lb)   SpO2 96%   BMI 30.41 kg/m   Physical Exam  Constitutional: He appears well-developed and well-nourished. No distress.  Patient is pleasant, but speech is garbled and not goal oriented.  HENT:  Head: Atraumatic.  Dry blood noted to bilateral nares. No hematoma, no septal hematoma, no malocclusion and no midface tenderness  Eyes: Conjunctivae are normal. Pupils are equal, round, and reactive to light.  Pupils equal however patient having difficulty tracking  Neck: Neck supple.  No nuchal rigidity  Cardiovascular: Normal rate and regular rhythm.  Pulmonary/Chest: He has rales (Scattered crackles heard).  Abdominal: He exhibits no distension. There is no tenderness.  Musculoskeletal:  Left shoulder: Patient exhibits no tenderness on palpation of the shoulder however he is unable to raise shoulder with active range of motion.  Neurological: He is alert.  Neurologic exam:  Speech is not fluent, and not goal oriented, pupils equal round reactive to light, difficult to track visual field due to short attention span Cranial nerves III through XII normal including no facial droop Follows commands, moves all extremities x3, having difficulty raising L arm. Normal grip strengthSensation normal to light touch and pinprick Coordination poor,  Left limb ataxia, no finger-nose-finger testing  pronator drift not tested Gait antalgic, leftward leaning  Alert to self and place, not to situation and time.   Skin: No rash noted.  Psychiatric: He has a normal mood and affect.  Nursing note and vitals reviewed.    ED Treatments / Results  Labs (all labs ordered are listed, but only abnormal results are displayed) Labs Reviewed  APTT - Abnormal; Notable for the following components:      Result Value   aPTT 41 (*)    All other components within normal limits  CBC - Abnormal; Notable for the following components:   WBC 12.9 (*)    RBC 4.04 (*)    All other components within normal limits  DIFFERENTIAL - Abnormal; Notable for the following components:   Neutro  Abs 10.7 (*)    All other components within normal limits  COMPREHENSIVE METABOLIC PANEL - Abnormal; Notable for the following components:   Glucose, Bld 100 (*)    BUN 25 (*)    Total Protein 8.4 (*)    AST 127 (*)    Total Bilirubin 1.5 (*)    GFR calc non Af Amer 55 (*)    All other components within normal limits  URINALYSIS, ROUTINE W REFLEX MICROSCOPIC - Abnormal; Notable for the following components:   Ketones, ur 20 (*)    Protein, ur 30 (*)    Bacteria, UA RARE (*)    Squamous Epithelial / LPF 0-5 (*)    All other components within normal limits  I-STAT CHEM 8, ED - Abnormal; Notable for the following components:   BUN 29 (*)    All other components within normal limits  PROTIME-INR  RAPID URINE DRUG SCREEN, HOSP PERFORMED  ETHANOL  AMMONIA  I-STAT TROPONIN, ED  CBG MONITORING, ED    EKG  EKG Interpretation  Date/Time:  Wednesday August 01 2017 11:15:03 EST Ventricular Rate:  70 PR Interval:  132 QRS Duration: 84 QT Interval:  408 QTC Calculation: 440 R Axis:   16 Text Interpretation:  Normal sinus rhythm Normal ECG No old tracing to compare Confirmed by Daleen Bo [phone]) on 08/01/2017 4:47:20 PM       Radiology Ct Head Wo  Contrast  Result Date: 08/01/2017 CLINICAL DATA:  Recurrent falls. EXAM: CT HEAD WITHOUT CONTRAST TECHNIQUE: Contiguous axial images were obtained from the base of the skull through the vertex without intravenous contrast. COMPARISON:  MRI brain dated November 24, 2016. CT head dated July 27, 2014. FINDINGS: Brain: No evidence of acute infarction, hemorrhage, hydrocephalus, extra-axial collection or mass lesion/mass effect. Stable mild atrophy and chronic microvascular ischemic changes. Unchanged tiny lacunar infarcts in the bilateral basal ganglia. Vascular: Calcified atherosclerosis at the skullbase. No hyperdense vessel. Skull: Normal. Negative for fracture or focal lesion. Sinuses/Orbits: No acute finding. Mucous retention cyst in the right maxillary sinus. Other: None. IMPRESSION: 1. No acute intracranial abnormality. Stable mild atrophy and chronic microvascular ischemic changes. Electronically Signed   By: Titus Dubin M.D.   On: 08/01/2017 13:09   Dg Shoulder Left  Result Date: 08/01/2017 CLINICAL DATA:  Patient has fallen 4 times over the past week. Patient has pain when lifting the left arm since the most recent fall. EXAM: LEFT SHOULDER - 2+ VIEW COMPARISON:  Limited views of the left shoulder from a chest x-ray of July 07, 2016 FINDINGS: The bones of the shoulder are subjectively adequately mineralized. No acute fracture or dislocation is observed. The positioning of the humeral head appears slightly superior with respect to the glenoid which may indicate rotator cuff pathology. IMPRESSION: No acute fracture or dislocation of the left shoulder is observed. I cannot exclude rotator cuff pathology given the slightly superior positioning of the humeral head with respect to the glenoid. Electronically Signed   By: David  Martinique M.D.   On: 08/01/2017 12:41    Procedures Procedures (including critical care time)  Medications Ordered in ED Medications - No data to display   Initial  Impression / Assessment and Plan / ED Course  I have reviewed the triage vital signs and the nursing notes.  Pertinent labs & imaging results that were available during my care of the patient were reviewed by me and considered in my medical decision making (see chart for details).     BP Marland Kitchen)  145/78   Pulse 68   Temp 98.4 F (36.9 C) (Oral)   Resp (!) 22   Ht 5\' 8"  (1.727 m)   Wt 90.7 kg (200 lb)   SpO2 92%   BMI 30.41 kg/m    Final Clinical Impressions(s) / ED Diagnoses   Final diagnoses:  Recurrent falls  Closed fracture of nasal bone, initial encounter  Altered mental status, unspecified altered mental status type    ED Discharge Orders    None     4:21 PM Patient with history of dementia, shown changes from his baseline.  Has recurrent falls, now was more confused and having difficulty raising his left arm.  Initial head CT scan without any acute finding, x-ray of left shoulder without acute fractures or dislocation but unable to assess for rotator cuff injury.  No significant discomfort on palpation of left shoulder. Pt will benefit from brain MRI and neuro evaluation once all labs are back.    11:54 PM Labs are reassuring, normal ammonia level, urine without signs of urinary tract infection, UDS without any detection of drugs, alcohol level is within normal limit, EKG and troponin unremarkable, mildly elevated white count of 12.9, max dose facial CT scan demonstrate nasal bone fracture.  Brain MRI without signs of strokes or acute finding.  Patient also does not have any nuchal rigidity concerning for meningitis.  Plan to consult medicine for admission for further evaluation of his altered mental status state.  I will also order a chest x-ray.\  12:17 AM Appreciate consultation from [facility] Dr. Blaine Hamper who agrees to admit pt for further evaluation of his altered mental status.       Domenic Moras, PA-C 08/02/17 0019    Macarthur Critchley, MD 08/02/17  [phone]

## 2017-08-01 NOTE — ED Triage Notes (Signed)
Pt here with significant other. Pt has had 4 falls over the last week. Pt noted to have left arm drift but also complains of left shoulder pain when lifting the left arm due to the most recent fall. She states that his speech is not normal and he has been disoriented at times. Pt also noted to have ataxia on both arms. VSS. Axox4 at this time but sluggish.

## 2017-08-02 ENCOUNTER — Inpatient Hospital Stay (HOSPITAL_COMMUNITY): Payer: Medicare Other

## 2017-08-02 ENCOUNTER — Encounter (HOSPITAL_COMMUNITY): Payer: Self-pay | Admitting: *Deleted

## 2017-08-02 ENCOUNTER — Emergency Department (HOSPITAL_COMMUNITY): Payer: Medicare Other

## 2017-08-02 DIAGNOSIS — S022XXA Fracture of nasal bones, initial encounter for closed fracture: Secondary | ICD-10-CM | POA: Diagnosis not present

## 2017-08-02 DIAGNOSIS — D72829 Elevated white blood cell count, unspecified: Secondary | ICD-10-CM | POA: Diagnosis not present

## 2017-08-02 DIAGNOSIS — F0390 Unspecified dementia without behavioral disturbance: Secondary | ICD-10-CM | POA: Diagnosis not present

## 2017-08-02 DIAGNOSIS — K219 Gastro-esophageal reflux disease without esophagitis: Secondary | ICD-10-CM | POA: Diagnosis present

## 2017-08-02 DIAGNOSIS — J61 Pneumoconiosis due to asbestos and other mineral fibers: Secondary | ICD-10-CM | POA: Diagnosis present

## 2017-08-02 DIAGNOSIS — R41841 Cognitive communication deficit: Secondary | ICD-10-CM | POA: Diagnosis not present

## 2017-08-02 DIAGNOSIS — G4733 Obstructive sleep apnea (adult) (pediatric): Secondary | ICD-10-CM | POA: Diagnosis present

## 2017-08-02 DIAGNOSIS — F259 Schizoaffective disorder, unspecified: Secondary | ICD-10-CM | POA: Diagnosis not present

## 2017-08-02 DIAGNOSIS — S022XXG Fracture of nasal bones, subsequent encounter for fracture with delayed healing: Secondary | ICD-10-CM | POA: Diagnosis not present

## 2017-08-02 DIAGNOSIS — F0391 Unspecified dementia with behavioral disturbance: Secondary | ICD-10-CM

## 2017-08-02 DIAGNOSIS — R262 Difficulty in walking, not elsewhere classified: Secondary | ICD-10-CM | POA: Diagnosis not present

## 2017-08-02 DIAGNOSIS — W19XXXA Unspecified fall, initial encounter: Secondary | ICD-10-CM | POA: Diagnosis not present

## 2017-08-02 DIAGNOSIS — W109XXA Fall (on) (from) unspecified stairs and steps, initial encounter: Secondary | ICD-10-CM | POA: Diagnosis present

## 2017-08-02 DIAGNOSIS — W19XXXD Unspecified fall, subsequent encounter: Secondary | ICD-10-CM | POA: Diagnosis not present

## 2017-08-02 DIAGNOSIS — Z7982 Long term (current) use of aspirin: Secondary | ICD-10-CM | POA: Diagnosis not present

## 2017-08-02 DIAGNOSIS — E039 Hypothyroidism, unspecified: Secondary | ICD-10-CM | POA: Diagnosis not present

## 2017-08-02 DIAGNOSIS — R740 Nonspecific elevation of levels of transaminase and lactic acid dehydrogenase [LDH]: Secondary | ICD-10-CM

## 2017-08-02 DIAGNOSIS — Z87891 Personal history of nicotine dependence: Secondary | ICD-10-CM | POA: Diagnosis not present

## 2017-08-02 DIAGNOSIS — Y9301 Activity, walking, marching and hiking: Secondary | ICD-10-CM | POA: Diagnosis present

## 2017-08-02 DIAGNOSIS — F411 Generalized anxiety disorder: Secondary | ICD-10-CM | POA: Diagnosis not present

## 2017-08-02 DIAGNOSIS — M6281 Muscle weakness (generalized): Secondary | ICD-10-CM | POA: Diagnosis not present

## 2017-08-02 DIAGNOSIS — R296 Repeated falls: Secondary | ICD-10-CM | POA: Diagnosis present

## 2017-08-02 DIAGNOSIS — Z79899 Other long term (current) drug therapy: Secondary | ICD-10-CM | POA: Diagnosis not present

## 2017-08-02 DIAGNOSIS — R05 Cough: Secondary | ICD-10-CM | POA: Diagnosis not present

## 2017-08-02 DIAGNOSIS — G9341 Metabolic encephalopathy: Secondary | ICD-10-CM | POA: Diagnosis not present

## 2017-08-02 DIAGNOSIS — R1310 Dysphagia, unspecified: Secondary | ICD-10-CM | POA: Diagnosis present

## 2017-08-02 DIAGNOSIS — I10 Essential (primary) hypertension: Secondary | ICD-10-CM | POA: Diagnosis present

## 2017-08-02 DIAGNOSIS — R6510 Systemic inflammatory response syndrome (SIRS) of non-infectious origin without acute organ dysfunction: Secondary | ICD-10-CM | POA: Diagnosis present

## 2017-08-02 DIAGNOSIS — R2681 Unsteadiness on feet: Secondary | ICD-10-CM | POA: Diagnosis not present

## 2017-08-02 DIAGNOSIS — R1312 Dysphagia, oropharyngeal phase: Secondary | ICD-10-CM | POA: Diagnosis not present

## 2017-08-02 DIAGNOSIS — J90 Pleural effusion, not elsewhere classified: Secondary | ICD-10-CM | POA: Diagnosis present

## 2017-08-02 LAB — COMPREHENSIVE METABOLIC PANEL
ALK PHOS: 55 U/L (ref 38–126)
ALT: 24 U/L (ref 17–63)
ANION GAP: 11 (ref 5–15)
AST: 120 U/L — ABNORMAL HIGH (ref 15–41)
Albumin: 3.7 g/dL (ref 3.5–5.0)
BUN: 28 mg/dL — ABNORMAL HIGH (ref 6–20)
CALCIUM: 8.5 mg/dL — AB (ref 8.9–10.3)
CO2: 21 mmol/L — AB (ref 22–32)
Chloride: 107 mmol/L (ref 101–111)
Creatinine, Ser: 1.06 mg/dL (ref 0.61–1.24)
GFR calc non Af Amer: >60 mL/min (ref >60)
Glucose, Bld: 92 mg/dL (ref 65–99)
Potassium: 3.7 mmol/L (ref 3.5–5.1)
SODIUM: 139 mmol/L (ref 135–145)
Total Bilirubin: 1.3 mg/dL — ABNORMAL HIGH (ref 0.3–1.2)
Total Protein: 6.8 g/dL (ref 6.5–8.1)

## 2017-08-02 LAB — HIV ANTIBODY (ROUTINE TESTING W REFLEX): HIV Screen 4th Generation wRfx: NONREACTIVE

## 2017-08-02 LAB — TSH: TSH: 6.772 u[IU]/mL — AB (ref 0.350–4.500)

## 2017-08-02 LAB — HEPATITIS PANEL, ACUTE
HCV Ab: <0.1 s/co ratio (ref 0.0–0.9)
HEP A IGM: NEGATIVE
HEP B C IGM: NEGATIVE
Hepatitis B Surface Ag: NEGATIVE

## 2017-08-02 LAB — RPR: RPR Ser Ql: NONREACTIVE

## 2017-08-02 LAB — FOLATE: FOLATE: 5 ng/mL — AB (ref >5.9)

## 2017-08-02 LAB — BRAIN NATRIURETIC PEPTIDE: B NATRIURETIC PEPTIDE 5: 60 pg/mL (ref 0.0–100.0)

## 2017-08-02 LAB — ACETAMINOPHEN LEVEL

## 2017-08-02 LAB — VITAMIN B12: Vitamin B-12: 291 pg/mL (ref 180–914)

## 2017-08-02 LAB — T4, FREE: Free T4: 0.81 ng/dL (ref 0.61–1.12)

## 2017-08-02 MED ORDER — FUROSEMIDE 10 MG/ML IJ SOLN
20.0000 mg | Freq: Once | INTRAMUSCULAR | Status: AC
  Administered 2017-08-02: 20 mg via INTRAVENOUS
  Filled 2017-08-02: qty 4

## 2017-08-02 MED ORDER — DEXTROSE 5 % IV SOLN
850.0000 mg | Freq: Three times a day (TID) | INTRAVENOUS | Status: DC
  Administered 2017-08-02 – 2017-08-03 (×3): 850 mg via INTRAVENOUS
  Filled 2017-08-02 (×5): qty 17

## 2017-08-02 MED ORDER — ENOXAPARIN SODIUM 40 MG/0.4ML ~~LOC~~ SOLN
40.0000 mg | SUBCUTANEOUS | Status: DC
  Administered 2017-08-04 – 2017-08-06 (×3): 40 mg via SUBCUTANEOUS
  Filled 2017-08-02 (×3): qty 0.4

## 2017-08-02 MED ORDER — ONDANSETRON HCL 4 MG/2ML IJ SOLN: 4.0000 mg | Freq: Four times a day (QID) | INTRAMUSCULAR | Status: DC | PRN

## 2017-08-02 MED ORDER — ORAL CARE MOUTH RINSE
15.0000 mL | Freq: Two times a day (BID) | OROMUCOSAL | Status: DC
  Administered 2017-08-02 – 2017-08-05 (×4): 15 mL via OROMUCOSAL

## 2017-08-02 MED ORDER — LEVOTHYROXINE SODIUM 100 MCG IV SOLR
50.0000 ug | Freq: Every day | INTRAVENOUS | Status: DC
  Administered 2017-08-02 – 2017-08-05 (×4): 50 ug via INTRAVENOUS
  Filled 2017-08-02 (×5): qty 5

## 2017-08-02 MED ORDER — ACETAMINOPHEN 650 MG RE SUPP: 650.0000 mg | Freq: Four times a day (QID) | RECTAL | Status: DC | PRN

## 2017-08-02 MED ORDER — ACETAMINOPHEN 325 MG PO TABS
650.0000 mg | ORAL_TABLET | Freq: Four times a day (QID) | ORAL | Status: DC | PRN
  Administered 2017-08-02 – 2017-08-04 (×3): 650 mg via ORAL
  Filled 2017-08-02 (×3): qty 2

## 2017-08-02 MED ORDER — SODIUM CHLORIDE 0.9 % IV SOLN
2.0000 g | INTRAVENOUS | Status: DC
  Administered 2017-08-02 – 2017-08-05 (×18): 2 g via INTRAVENOUS
  Filled 2017-08-02 (×20): qty 2000

## 2017-08-02 MED ORDER — VANCOMYCIN HCL IN DEXTROSE 1-5 GM/200ML-% IV SOLN
1000.0000 mg | Freq: Two times a day (BID) | INTRAVENOUS | Status: DC
  Administered 2017-08-02 – 2017-08-04 (×5): 1000 mg via INTRAVENOUS
  Filled 2017-08-02 (×6): qty 200

## 2017-08-02 MED ORDER — ENOXAPARIN SODIUM 40 MG/0.4ML ~~LOC~~ SOLN
40.0000 mg | SUBCUTANEOUS | Status: DC
  Administered 2017-08-02: 40 mg via SUBCUTANEOUS
  Filled 2017-08-02: qty 0.4

## 2017-08-02 MED ORDER — HYDRALAZINE HCL 20 MG/ML IJ SOLN: 5.0000 mg | INTRAMUSCULAR | Status: DC | PRN

## 2017-08-02 MED ORDER — SODIUM CHLORIDE 0.9 % IV SOLN
2.0000 g | Freq: Two times a day (BID) | INTRAVENOUS | Status: DC
  Administered 2017-08-02 – 2017-08-05 (×5): 2 g via INTRAVENOUS
  Filled 2017-08-02 (×7): qty 20

## 2017-08-02 MED ORDER — ONDANSETRON HCL 4 MG PO TABS: 4.0000 mg | ORAL_TABLET | Freq: Four times a day (QID) | ORAL | Status: DC | PRN

## 2017-08-02 NOTE — H&P (Signed)
History and Physical    Jonathon Grimes:[id number] DOB: 1941-04-17 DOA: 08/01/2017  Referring MD/NP/PA:   PCP: Manon Hilding, MD   Patient coming from:  The patient is coming from home.  At baseline, pt is independent for most of ADL.     Chief Complaint: Altered mental status, fall  HPI: Jonathon Grimes is a 77 y.o. male with medical history significant of dementia, OSA not on CPAP, elevated transaminase, asbestosis, GERD, hypothyroidism, anxiety, who presents with altered mental status and fall.  Per patient's fianc, patient has been confused for more than one week which has been progressively getting worse. Patient is easily agitated, yelling and screaming a lot. He fell 4 times. He injured his left shoulder. He does not seem to have unilateral weakness or numbness in extremities. No facial droop or slurred speech. Does not seem to have chest pain, SOB, cough, nausea, vomiting, diarrhea or abdominal pain. No sure if he has any symptoms for UTI.  ED Course: pt was found to have WBC 12.9, INR 1.06, UDS negative, negative urinalysis, creatinine 1.10, abnormal liver functions with AST 127, ALT 26, total bilirubin 1.5, ALP 60, alcohol less than 10, ammonia 25. Temperature normal, no tachycardia, oxygen saturation 92% on room air. CT head is negative. MRI of brain is negative. X-ray of her left shoulder is negative for bony fracture, but cannot rule out rotator cuff pathology. CT of maxillofacial showed minimally displaced fractures of the left nasal bone and anterior most nasal septum. Pt is admitted to telemetry bed as inpatient.  Review of Systems: Could not be reviewed due to altered mental status.  Allergy:  Allergies  Allergen Reactions  . Celebrex [Celecoxib] Other (See Comments)    Unknown    Past Medical History:  Diagnosis Date  . Abdominal pain, generalized   . Asbestosis(501)    diagnosed in 2001  . Early satiety   . GERD (gastroesophageal reflux disease)   . Heartburn    . History of colonoscopy    2008 Dr. Anthony Sar and was normal.  . Hypertension   . Hypothyroidism   . Nonspecific elevation of levels of transaminase or lactic acid dehydrogenase (LDH)   . Open wound of finger(s) , without mention of complication     Past Surgical History:  Procedure Laterality Date  . CHOLECYSTECTOMY     1994 [facility]: gallstones  . ESOPHAGOGASTRODUODENOSCOPY  03/30/2011   Procedure: ESOPHAGOGASTRODUODENOSCOPY (EGD);  Surgeon: Rogene Houston, MD;  Location: [facility];  Service: Endoscopy;  Laterality: N/A;  . EYE SURGERY  April 2015   Ruptured vessel in the back of your right eye  . KNEE CARTILAGE SURGERY     Bilateral    Social History:  reports that he has quit smoking. He started smoking about 5 years ago. he has never used smokeless tobacco. He reports that he does not drink alcohol or use drugs.  Family History:  Family History  Problem Relation Age of Onset  . Heart attack Father      Prior to Admission medications   Medication Sig Start Date End Date Taking? Authorizing Provider  aspirin 81 MG chewable tablet Chew 81 mg by mouth daily.   Yes [provider]  calcium-vitamin D 250-100 MG-UNIT tablet Take 1 tablet by mouth daily.   Yes [provider]  levothyroxine (SYNTHROID, LEVOTHROID) 100 MCG tablet Take 100 mcg by mouth daily before breakfast.   Yes [provider]  diazepam (VALIUM) 5 MG tablet Take 1  tablet (5 mg total) by mouth at bedtime. Patient not taking: Reported on 08/01/2017 03/24/15   Britt Bottom, MD  donepezil (ARICEPT) 10 MG tablet Take 1 tablet (10 mg total) by mouth at bedtime. Patient not taking: Reported on 08/01/2017 04/28/15   Britt Bottom, MD  Vitamin D, Ergocalciferol, (DRISDOL) 50000 units CAPS capsule Take 1 capsule (50,000 Units total) by mouth every 7 (seven) days. Once done with prescription, take over the counter Vitamin D 2,000u once daily Patient not taking: Reported on 08/01/2017 11/15/16    Britt Bottom, MD    Physical Exam: Vitals:   08/01/17 2230 08/02/17 0130 08/02/17 0230 08/02/17 0430  BP: (!) 145/78 (!) 151/87 (!) 147/78 (!) 141/73  Pulse: 68 76 72 70  Resp:   18 18  Temp:   98.7 F (37.1 C) 99.2 F (37.3 C)  TempSrc:   Oral Oral  SpO2: 92% 98% 98% 97%  Weight:   85.8 kg (189 lb 2.5 oz)   Height:   5\' 8"  (1.727 m)    General: Not in acute distress HEENT:       Eyes: PERRL, EOMI, no scleral icterus.       ENT: No discharge from the ears and nose, no pharynx injection, no tonsillar enlargement.        Neck: No JVD, no bruit, no mass felt. Heme: No neck lymph node enlargement. Cardiac: S1/S2, RRR, No murmurs, No gallops or rubs. Respiratory: No rales, wheezing, rhonchi or rubs. GI: Soft, nondistended, nontender, no rebound pain, no organomegaly, BS present. GU: No hematuria Ext: No pitting leg edema bilaterally. 2+DP/PT pulse bilaterally. Musculoskeletal: No joint deformities, No joint redness or warmth, no limitation of ROM in spin. Skin: No rashes.  Neuro: confused, not oriented X3, cranial nerves II-XII grossly intact, moves all extremities. Psych: Patient is not psychotic.  Labs on Admission: I have personally reviewed following labs and imaging studies  CBC: Recent Labs  Lab 08/01/17 1125 08/01/17 1142  WBC 12.9*  --   NEUTROABS 10.7*  --   HGB 13.4 14.3  HCT 40.1 42.0  MCV 99.3  --   PLT 201  --    Basic Metabolic Panel: Recent Labs  Lab 08/01/17 1125 08/01/17 1142 08/02/17 0330  NA 141 143 139  K 4.4 4.3 3.7  CL 107 106 107  CO2 24  --  21*  GLUCOSE 100* 95 92  BUN 25* 29* 28*  CREATININE 1.23 1.10 1.06  CALCIUM 9.1  --  8.5*   GFR: Estimated Creatinine Clearance: 62.2 mL/min (by C-G formula based on SCr of 1.06 mg/dL). Liver Function Tests: Recent Labs  Lab 08/01/17 1125 08/02/17 0330  AST 127* 120*  ALT 26 24  ALKPHOS 60 55  BILITOT 1.5* 1.3*  PROT 8.4* 6.8  ALBUMIN 4.2 3.7   No results for input(s): LIPASE,  AMYLASE in the last 168 hours. Recent Labs  Lab 08/01/17 2108  AMMONIA 25   Coagulation Profile: Recent Labs  Lab 08/01/17 1125  INR 1.06   Cardiac Enzymes: No results for input(s): CKTOTAL, CKMB, CKMBINDEX, TROPONINI in the last 168 hours. BNP (last 3 results) No results for input(s): PROBNP in the last 8760 hours. HbA1C: No results for input(s): HGBA1C in the last 72 hours. CBG: No results for input(s): GLUCAP in the last 168 hours. Lipid Profile: No results for input(s): CHOL, HDL, LDLCALC, TRIG, CHOLHDL, LDLDIRECT in the last 72 hours. Thyroid Function Tests: Recent Labs    08/02/17 0330  TSH 6.772*   Anemia Panel: Recent Labs    08/02/17 0330  VITAMINB12 291  FOLATE 5.0*   Urine analysis:    Component Value Date/Time   COLORURINE YELLOW 08/01/2017 1847   APPEARANCEUR CLEAR 08/01/2017 1847   LABSPEC 1.027 08/01/2017 1847   PHURINE 5.0 08/01/2017 1847   GLUCOSEU NEGATIVE 08/01/2017 1847   HGBUR NEGATIVE 08/01/2017 1847   BILIRUBINUR NEGATIVE 08/01/2017 1847   KETONESUR 20 (A) 08/01/2017 1847   PROTEINUR 30 (A) 08/01/2017 1847   NITRITE NEGATIVE 08/01/2017 1847   LEUKOCYTESUR NEGATIVE 08/01/2017 1847   Sepsis Labs: @LABRCNTIP (procalcitonin:4,lacticidven:4) )No results found for this or any previous visit (from the past 240 hour(s)).   Radiological Exams on Admission: Dg Chest 2 View  Result Date: 08/02/2017 CLINICAL DATA:  Altered mental status. Four falls over the past week. EXAM: CHEST - 2 VIEW COMPARISON:  Radiographs and CT 07/17/2016 FINDINGS: Low lung volumes. Bronchovascular crowding and accentuated heart size secondary to hypoaeration. Streaky bibasilar atelectasis. Possible tiny residual right pleural effusion. No confluent airspace disease. No pneumothorax. No acute osseous abnormalities are seen. IMPRESSION: Hypoventilatory chest with bronchovascular crowding and bibasilar atelectasis. Possible right pleural effusion. Electronically Signed   By:  Jeb Levering M.D.   On: 08/02/2017 02:35   Ct Head Wo Contrast  Result Date: 08/01/2017 CLINICAL DATA:  Recurrent falls. EXAM: CT HEAD WITHOUT CONTRAST TECHNIQUE: Contiguous axial images were obtained from the base of the skull through the vertex without intravenous contrast. COMPARISON:  MRI brain dated November 24, 2016. CT head dated July 27, 2014. FINDINGS: Brain: No evidence of acute infarction, hemorrhage, hydrocephalus, extra-axial collection or mass lesion/mass effect. Stable mild atrophy and chronic microvascular ischemic changes. Unchanged tiny lacunar infarcts in the bilateral basal ganglia. Vascular: Calcified atherosclerosis at the skullbase. No hyperdense vessel. Skull: Normal. Negative for fracture or focal lesion. Sinuses/Orbits: No acute finding. Mucous retention cyst in the right maxillary sinus. Other: None. IMPRESSION: 1. No acute intracranial abnormality. Stable mild atrophy and chronic microvascular ischemic changes. Electronically Signed   By: Titus Dubin M.D.   On: 08/01/2017 13:09   Mr Brain Wo Contrast  Result Date: 08/01/2017 CLINICAL DATA:  Initial evaluation for acute altered mental status. EXAM: MRI HEAD WITHOUT CONTRAST TECHNIQUE: Multiplanar, multiecho pulse sequences of the brain and surrounding structures were obtained without intravenous contrast. COMPARISON:  Prior CT from earlier the same day. FINDINGS: Brain: Generalized age-related cerebral atrophy. Minimal T2/FLAIR hyperintensity within the periventricular white matter, nonspecific, but felt to be within normal limits for age. No abnormal foci of restricted diffusion to suggest acute or subacute ischemia. Gray-white matter differentiation maintained. No evidence for remote cortical infarction. Possible tiny remote left basal ganglia lacunar infarct noted. No evidence for acute intracranial hemorrhage. No mass lesion, midline shift or mass effect. Mild diffuse ventricular prominence related to global parenchymal  volume loss of hydrocephalus. No extra-axial fluid collection. Major dural sinuses grossly patent. Pituitary gland suprasellar region normal. Midline structures intact and normal. Vascular: Major intracranial vascular flow voids are maintained. Skull and upper cervical spine: Craniocervical junction normal. Visualized upper cervical spine within normal limits. Bone marrow signal intensity normal. No scalp soft tissue abnormality. Sinuses/Orbits: Globes and orbital soft tissues within normal limits. Patient status post cataract extraction bilaterally. Mild scattered mucosal thickening within the ethmoidal air cells and maxillary sinuses. Right maxillary sinus retention cyst partially visualized. No significant mastoid effusion. Partially visualized parotid glands are atrophic in appearance bilaterally. Other: None. IMPRESSION: Negative brain MRI.  No acute intracranial process  identified. Electronically Signed   By: Jeannine Boga M.D.   On: 08/01/2017 23:35   Dg Shoulder Left  Result Date: 08/01/2017 CLINICAL DATA:  Patient has fallen 4 times over the past week. Patient has pain when lifting the left arm since the most recent fall. EXAM: LEFT SHOULDER - 2+ VIEW COMPARISON:  Limited views of the left shoulder from a chest x-ray of July 07, 2016 FINDINGS: The bones of the shoulder are subjectively adequately mineralized. No acute fracture or dislocation is observed. The positioning of the humeral head appears slightly superior with respect to the glenoid which may indicate rotator cuff pathology. IMPRESSION: No acute fracture or dislocation of the left shoulder is observed. I cannot exclude rotator cuff pathology given the slightly superior positioning of the humeral head with respect to the glenoid. Electronically Signed   By: David  Martinique M.D.   On: 08/01/2017 12:41   Ct Maxillofacial Wo Contrast  Result Date: 08/01/2017 CLINICAL DATA:  77 y/o M; multiple falls over last week, nasal fracture  suspected. EXAM: CT MAXILLOFACIAL WITHOUT CONTRAST TECHNIQUE: Multidetector CT imaging of the maxillofacial structures was performed. Multiplanar CT image reconstructions were also generated. COMPARISON:  08/01/2017 CT head. FINDINGS: Osseous: Minimally displaced fractures of the left nasal bone and anterior most nasal septum (series 4, image 70 and series 5, image 18). No other facial fracture identified. Orbits: Negative. No traumatic or inflammatory finding. Sinuses: Right maxillary sinus mucous retention cyst and aerosolized secretions in the right sphenoid sinus. Otherwise negative. Soft tissues: Negative. Limited intracranial: No significant or unexpected finding. IMPRESSION: Minimally displaced fractures of the left nasal bone and anterior most nasal septum. No other fracture identified. Electronically Signed   By: Kristine Garbe M.D.   On: 08/01/2017 17:07     EKG: Independently reviewed.  Sinus rhythm, QTC 440, early R-wave progression, nonspecific T-wave change.  Assessment/Plan Principal Problem:   Acute metabolic encephalopathy Active Problems:   Transaminasemia   Dementia   Hypothyroid   OSA (obstructive sleep apnea)   Fall   Nasal bone fracture   Leukocytosis   Acute metabolic encephalopathy: Etiology is not clear.  CT head is negative. MRI of brain is negative. Patient has leukocytosis, but no fever. Urinalysis negative. Chest x-rays negative. Patient does not have neck rigidity, less likely to have meningitis. Potential differential diagnoses include worsening dementia with behavioral change. Patient has abnormal liver function, but ammonia level is only 25, less likely to have hepatic encephalopathy.  -will admit to tele bed as inpt -check vitamin B12, folate, RPR, TSH, HIV antibody -Frequent neurologic checks -hold oral meds until mental status improves -consult SW and CM  Transaminasemia: AST 127, ALT 26, total bilirubin 1.5, ALP 60 -check Tylenol  level -Check hepatitis panel  Hypothyroidism: Last TSH was 8.8 on 11/13/16 -Continue Synthroid-->switch to IV, cut dose by half, from 100-->36mcg daily -Check TSH  Dementia -hold donepezil   Fall: due to AMS. CT-head negaive -PT/OT when able to  Nasal bone fracture: -amy need to discuss with RNT  Leukocytosis: WBC 12.9. No source of infection identified. Urinalysis negative. Chest x-ray negative. Likely due to demyelination. -get Bx  -f/u by CBC   DVT ppx:   SQ Lovenox Code Status: Full code Family Communication:  Yes, patient's fianc   at bed side Disposition Plan:  Anticipate discharge back to previous home environment Consults called:  none Admission status: Inpatient/tele     Date of Service 08/02/2017    [facility] Pager [phone]  If 7PM-7AM, please contact night-coverage [email or web address] Password [facility] 08/02/2017, 7:19 AM

## 2017-08-02 NOTE — Progress Notes (Signed)
Modified Barium Swallow Progress Note  Patient Details  Name: MCCLELLAN DEMARAIS MRN: [id number] Date of Birth: 11-07-1940  Today's Date: 08/02/2017  Modified Barium Swallow completed.  Full report located under Chart Review in the Imaging Section.  Brief recommendations include the following:  Clinical Impression  MBS complete. Patient presents with a mild-moderate oropharyngeal dysphagia characterized by decreased awareness of bolus, intermittent delayed swallow initiation, and mild weakness resulting in trace penetration of thin liquids without response. Nectar thick liquids and solids consumed with full airway protection. Pharyngeal strength largely intact with only trace coating in vallecula post swallow as study progressed with one episode of resultant trace pentration (thin liquid) post swallow. Note that penetration episodes progressed with increased restlessness. Recommend conservative diet with potential to advance at bedside. Patient likely to be able to consume thin liquids at bedside with improved mentation including improved awareness of surroundings, ability to sustain attention, and follow simple directions. Will f/u.   Swallow Evaluation Recommendations       SLP Diet Recommendations: Dysphagia 3 (Mech soft) solids;Nectar thick liquid   Liquid Administration via: Cup;No straw   Medication Administration: Crushed with puree   Supervision: Staff to assist with self feeding;Full supervision/cueing for compensatory strategies   Compensations: Slow rate;Small sips/bites   Postural Changes: Seated upright at 90 degrees   Oral Care Recommendations: Oral care BID   Other Recommendations: Order thickener from pharmacy;Prohibited food (jello, ice cream, thin soups);Remove water pitcher   Gabriel Rainwater MA, CCC-SLP [phone]  Gabriel Rainwater Meryl 08/02/2017,1:04 PM

## 2017-08-02 NOTE — Evaluation (Signed)
Clinical/Bedside Swallow Evaluation Patient Details  Name: Jonathon Grimes MRN: [id number] Date of Birth: Oct 22, 1940  Today's Date: 08/02/2017 Time: SLP Start Time (ACUTE ONLY): 0940 SLP Stop Time (ACUTE ONLY): 0959 SLP Time Calculation (min) (ACUTE ONLY): 19 min  Past Medical History:  Past Medical History:  Diagnosis Date  . Abdominal pain, generalized   . Asbestosis(501)    diagnosed in 2001  . Early satiety   . GERD (gastroesophageal reflux disease)   . Heartburn   . History of colonoscopy    2008 Dr. Anthony Sar and was normal.  . Hypertension   . Hypothyroidism   . Nonspecific elevation of levels of transaminase or lactic acid dehydrogenase (LDH)   . Open wound of finger(s) , without mention of complication    Past Surgical History:  Past Surgical History:  Procedure Laterality Date  . CHOLECYSTECTOMY     1994 [facility]: gallstones  . ESOPHAGOGASTRODUODENOSCOPY  03/30/2011   Procedure: ESOPHAGOGASTRODUODENOSCOPY (EGD);  Surgeon: Rogene Houston, MD;  Location: [facility];  Service: Endoscopy;  Laterality: N/A;  . EYE SURGERY  April 2015   Ruptured vessel in the back of your right eye  . KNEE CARTILAGE SURGERY     Bilateral   HPI:  Jonathon Grimes is a 77 y.o. male with medical history significant of dementia, OSA not on CPAP, elevated transaminase, asbestosis, GERD, hypothyroidism, anxiety, who presents with altered mental status and fall.   Assessment / Plan / Recommendation Clinical Impression  Patient presents with overt indication of aspiration characterized by cough post swallow with liquid consistencies tested. Suspect delayed swallow initiation due to reduced awareness of bolus as patient requires max clinician cueing for attention to task, highly distractable. Solids consumed without evidence of aspiration although oral transit with soft solids significantly delayed. Although CXR negative, patient with consistent expiratory wheeze. Risk of aspiration increased with  acute changed in mentation. Recommend MBS to determine least restrictive diet.  SLP Visit Diagnosis: Dysphagia, oropharyngeal phase (R13.12)    Aspiration Risk  Moderate aspiration risk    Diet Recommendation NPO except meds   Medication Administration: Crushed with puree    Other  Recommendations Oral Care Recommendations: Oral care QID   Follow up Recommendations (TBD)             Prognosis Prognosis for Safe Diet Advancement: Good Barriers to Reach Goals: Cognitive deficits      Swallow Study   General HPI: Jonathon Grimes is a 77 y.o. male with medical history significant of dementia, OSA not on CPAP, elevated transaminase, asbestosis, GERD, hypothyroidism, anxiety, who presents with altered mental status and fall. Type of Study: Bedside Swallow Evaluation Previous Swallow Assessment: none noted Diet Prior to this Study: NPO Temperature Spikes Noted: No Respiratory Status: Room air History of Recent Intubation: No Behavior/Cognition: Lethargic/Drowsy;Distractible;Requires cueing;Doesn't follow directions Oral Cavity Assessment: Dry Oral Care Completed by SLP: Yes Oral Cavity - Dentition: Dentures, top;Missing dentition;Poor condition Vision: Functional for self-feeding Self-Feeding Abilities: Needs assist Patient Positioning: Upright in bed Baseline Vocal Quality: Normal(expiratory wheeze) Volitional Cough: Strong Volitional Swallow: Able to elicit    Oral/Motor/Sensory Function Overall Oral Motor/Sensory Function: Within functional limits   Ice Chips Ice chips: Within functional limits   Thin Liquid Thin Liquid: Impaired Presentation: Cup;Straw Pharyngeal  Phase Impairments: Cough - Immediate    Nectar Thick Nectar Thick Liquid: Impaired Presentation: Cup Pharyngeal Phase Impairments: Cough - Immediate   Honey Thick Honey Thick Liquid: Not tested   Puree Puree: Within functional limits Presentation:  Spoon   Solid   GO   Solid: Impaired Oral Phase  Impairments: Impaired mastication Oral Phase Functional Implications: Prolonged oral transit;Oral residue       Jonathon Hannifin MA, CCC-SLP [phone]  Jonathon Grimes 08/02/2017,10:43 AM

## 2017-08-02 NOTE — Progress Notes (Signed)
Pharmacy Antibiotic Note  Jonathon Grimes is a 77 y.o. male staring on Vancomycin, Ceftriaxone and Ampicillin for coverage for sepsis, pneumonia and meningitis.  LP ordered. Spoke with Dr. Lisbeth Ply, who would like to begin antibiotics ASAP, rather than waiting for LP.     Temp 102.3, WBC 12.9. Blood cultures sent.  Plan:  Ceftriaxone 2 gm IV q12hrs.  Vancomycin 1gm IV q12hrs.  Ampicillin 2 gm IV q4hrs.  Fololw renal function, culture data, clinical progress and plans.  Height: 5\' 8"  (172.7 cm) Weight: 189 lb 2.5 oz (85.8 kg) IBW/kg (Calculated) : 68.4  Temp (24hrs), Avg:100.3 F (37.9 C), Min:98.7 F (37.1 C), Max:102.3 F (39.1 C)  Recent Labs  Lab 08/01/17 1125 08/01/17 1142 08/02/17 0330  WBC 12.9*  --   --   CREATININE 1.23 1.10 1.06    Estimated Creatinine Clearance: 62.2 mL/min (by C-G formula based on SCr of 1.06 mg/dL).    Allergies  Allergen Reactions  . Celebrex [Celecoxib] Other (See Comments)    Unknown    Antimicrobials this admission:  Vancomycin 3/7>>  Ceftriaxone 3/7>>  Ampicillin 3/7>>  Dose adjustments this admission:  n/a  Microbiology results:  3/7 blood x 2 -   Thank you for allowing pharmacy to be a part of this patient's care.  Arty Baumgartner, [place] Pager: [phone] or [phone] 08/02/2017 2:43 PM

## 2017-08-02 NOTE — Plan of Care (Signed)
  Education: Knowledge of General Education information will improve 08/02/2017 0424 - Progressing by Carin Hock I, RN   Clinical Measurements: Ability to maintain clinical measurements within normal limits will improve 08/02/2017 0424 - Progressing by Marcos Eke, RN   Clinical Measurements: Diagnostic test results will improve 08/02/2017 0424 - Progressing by Carin Hock I, RN   Clinical Measurements: Respiratory complications will improve 08/02/2017 0424 - Progressing by Marcos Eke, RN

## 2017-08-02 NOTE — Care Management Note (Signed)
Case Management Note  Patient Details  Name: Jonathon Grimes MRN: [id number] Date of Birth: 1940/09/22  Subjective/Objective:       Pt admitted with a fall and acute metabolic encephalopathy. He is from home with his fiance.             Action/Plan: MD please order PT/OT evals to assist with d/c disposition. CM following for d/c needs, physician orders.  Expected Discharge Date:  08/04/17               Expected Discharge Plan:     In-House Referral:     Discharge planning Services     Post Acute Care Choice:    Choice offered to:     DME Arranged:    DME Agency:     HH Arranged:    HH Agency:     Status of Service:  In process, will continue to follow  If discussed at Long Length of Stay Meetings, dates discussed:    Additional Comments:  Pollie Friar, RN 08/02/2017, 11:56 AM

## 2017-08-02 NOTE — Progress Notes (Signed)
Patients  Admitted from home through [facility]  ED   To #W 27 c/o recurrent falls and AMS. Pt alert and oriented to person and place and situation. Oriented to unit staff and room. Plan of care to pt and girllfriend  At bedside . Safety and fall   Precautionary measures education given and both pt and girl friend verbalized good understand. Pt slightly disoriented but needs frequent cue and direction. C/o pain to left shoulder eye pack and pillow for elevation. Applied. No acute distress at present. Will continue to monitor.

## 2017-08-02 NOTE — Progress Notes (Addendum)
Not a billable note PROGRESS NOTE  Jonathon Grimes DDU:[id number] DOB: February 05, 1941 DOA: 08/01/2017 PCP: Manon Hilding, MD  HPI/Recap of past 24 hours:  Jonathon Grimes is a 77 y.o. year old male with medical history significant for dementia with behavioral disturbance, hypothyroidism, OSA not on CPAP, GERD  who presented on 08/01/2017 with worsening confusion and agitation x 1 week and recurrent falls and was found to have acute encephalopathy of unclear etiology with SIRS. CT head and MRI brain were unrevealing.     Interval History Still very confused. Can only say name and denies any pain.   Assessment/Plan: Principal Problem:   Acute metabolic encephalopathy Active Problems:   Transaminasemia   Dementia   Hypothyroid   OSA (obstructive sleep apnea)   Fall   Nasal bone fracture   Leukocytosis   # Acute encephalopathy Baseline dementia with behavioral disturbance with worsening behavior x 1 week Fever plus confusion meningitis on differential albeit no meningismus and MRI brain no temporal involvement(herpes less likely) No obvious meds from med rec ( no longer taking aricept at home) Metabolic workup negative so far (B12 wnl, negative UDS, UA neg, acetaminophen level wnl, ammonia/ethanol wnl, glucose wnl) - f/u RPR and HIV -empiric vancomycin, ceftriaxone, ampicillin -speak with family for more specific baseline -neuro checks -avoid sedating medications  #SIRS, with unclear etiology Leukocytosis and febrile but unclear source Given confusion will rule out meningitis CXR with pleural effusion and hypoventilation but no clear signs of pna MRI/CT head unrevealing UA negative No localizing signs or symptoms otherwise Pleural effusion could be hiding potential pna -obtain LP - empiric antibiotics as mentioned above - f/u blood cultures - tylenol PRN  #R Pleural effusion Audible crackles as well though no other signs of volume overload may benefit from diuresis No  supplemental oxygen needed No known history of CHF -BNP - will try IV lasix now - consider thoracentesis if no improvement  #Elevated AST Ammonia, plt, INR all normal t bili 1.5 Tylenol level wnl No known liver pathology No abd pain on exam -f/u hepatitis panel  #Dementia -not taking home aricept  #Hypothyroidism TSH 6.7, likely not adherent at home given high value or may need increase home dose if he is adherent - continue home dose syhroid   #Recurrent falls In setting in worsening confusion No acute abnormalities in CT head imaging Metabolic workup as mentioned above negative -PT/OT once mental status improves  Code Status: Full   Family Communication: will speak with significant other  Disposition Plan: LP, continue empiric antibiotics, hopeful for improvement in mental status.    Consultants:  None   Procedures:  Plan for LP   Antimicrobials:  3/7: vancomycin  3/7 ampicillin  3/7 ceftriaxone  Cultures:  3/7 blood -NGTD  Telemetry:Yes  DVT prophylaxis:  lovenox   Objective: Vitals:   08/02/17 0230 08/02/17 0430 08/02/17 0801 08/02/17 1130  BP: (!) 147/78 (!) 141/73 (!) 151/73 (!) 142/85  Pulse: 72 70 83 88  Resp: 18 18 18 18   Temp: 98.7 F (37.1 C) 99.2 F (37.3 C) 100 F (37.8 C) (!) 102.3 F (39.1 C)  TempSrc: Oral Oral Axillary Axillary  SpO2: 98% 97% 96% 97%  Weight: 85.8 kg (189 lb 2.5 oz)     Height: 5\' 8"  (1.727 m)      No intake or output data in the 24 hours ending 08/02/17 1221 Filed Weights   08/01/17 1124 08/02/17 0230  Weight: 90.7 kg (200 lb) 85.8 kg (189  lb 2.5 oz)    Exam:  General: comfortably Lying in bed,no apparent distres Eyes: EOMI, anicteric ENT: Dry oral Mucosa  Cardiovascular: regular rate and rhythm, no murmurs, rubs or gallops, no JVD or edema Respiratory: Normal respiratory effort on room air, audible crackles, crackles at bases greater on right side Abdomen: soft, non-distended, non-tender,  normal bowel sounds Skin: No Rash Musculoskeletal:Good ROM. No appreciable meningismus Neurologic: Alert to self only.  Grossly no focal neuro deficit.   Data Reviewed: CBC: Recent Labs  Lab 08/01/17 1125 08/01/17 1142  WBC 12.9*  --   NEUTROABS 10.7*  --   HGB 13.4 14.3  HCT 40.1 42.0  MCV 99.3  --   PLT 201  --    Basic Metabolic Panel: Recent Labs  Lab 08/01/17 1125 08/01/17 1142 08/02/17 0330  NA 141 143 139  K 4.4 4.3 3.7  CL 107 106 107  CO2 24  --  21*  GLUCOSE 100* 95 92  BUN 25* 29* 28*  CREATININE 1.23 1.10 1.06  CALCIUM 9.1  --  8.5*   GFR: Estimated Creatinine Clearance: 62.2 mL/min (by C-G formula based on SCr of 1.06 mg/dL). Liver Function Tests: Recent Labs  Lab 08/01/17 1125 08/02/17 0330  AST 127* 120*  ALT 26 24  ALKPHOS 60 55  BILITOT 1.5* 1.3*  PROT 8.4* 6.8  ALBUMIN 4.2 3.7   No results for input(s): LIPASE, AMYLASE in the last 168 hours. Recent Labs  Lab 08/01/17 2108  AMMONIA 25   Coagulation Profile: Recent Labs  Lab 08/01/17 1125  INR 1.06   Cardiac Enzymes: No results for input(s): CKTOTAL, CKMB, CKMBINDEX, TROPONINI in the last 168 hours. BNP (last 3 results) No results for input(s): PROBNP in the last 8760 hours. HbA1C: No results for input(s): HGBA1C in the last 72 hours. CBG: No results for input(s): GLUCAP in the last 168 hours. Lipid Profile: No results for input(s): CHOL, HDL, LDLCALC, TRIG, CHOLHDL, LDLDIRECT in the last 72 hours. Thyroid Function Tests: Recent Labs    08/02/17 0330 08/02/17 0832  TSH 6.772*  --   FREET4  --  0.81   Anemia Panel: Recent Labs    08/02/17 0330  VITAMINB12 291  FOLATE 5.0*   Urine analysis:    Component Value Date/Time   COLORURINE YELLOW 08/01/2017 1847   APPEARANCEUR CLEAR 08/01/2017 1847   LABSPEC 1.027 08/01/2017 1847   PHURINE 5.0 08/01/2017 1847   GLUCOSEU NEGATIVE 08/01/2017 1847   HGBUR NEGATIVE 08/01/2017 1847   BILIRUBINUR NEGATIVE 08/01/2017 1847    KETONESUR 20 (A) 08/01/2017 1847   PROTEINUR 30 (A) 08/01/2017 1847   NITRITE NEGATIVE 08/01/2017 1847   LEUKOCYTESUR NEGATIVE 08/01/2017 1847   Sepsis Labs: @LABRCNTIP (procalcitonin:4,lacticidven:4)  )No results found for this or any previous visit (from the past 240 hour(s)).    Studies: Dg Chest 2 View  Result Date: 08/02/2017 CLINICAL DATA:  Altered mental status. Four falls over the past week. EXAM: CHEST - 2 VIEW COMPARISON:  Radiographs and CT 07/17/2016 FINDINGS: Low lung volumes. Bronchovascular crowding and accentuated heart size secondary to hypoaeration. Streaky bibasilar atelectasis. Possible tiny residual right pleural effusion. No confluent airspace disease. No pneumothorax. No acute osseous abnormalities are seen. IMPRESSION: Hypoventilatory chest with bronchovascular crowding and bibasilar atelectasis. Possible right pleural effusion. Electronically Signed   By: Jeb Levering M.D.   On: 08/02/2017 02:35   Ct Head Wo Contrast  Result Date: 08/01/2017 CLINICAL DATA:  Recurrent falls. EXAM: CT HEAD WITHOUT CONTRAST TECHNIQUE: Contiguous axial  images were obtained from the base of the skull through the vertex without intravenous contrast. COMPARISON:  MRI brain dated November 24, 2016. CT head dated July 27, 2014. FINDINGS: Brain: No evidence of acute infarction, hemorrhage, hydrocephalus, extra-axial collection or mass lesion/mass effect. Stable mild atrophy and chronic microvascular ischemic changes. Unchanged tiny lacunar infarcts in the bilateral basal ganglia. Vascular: Calcified atherosclerosis at the skullbase. No hyperdense vessel. Skull: Normal. Negative for fracture or focal lesion. Sinuses/Orbits: No acute finding. Mucous retention cyst in the right maxillary sinus. Other: None. IMPRESSION: 1. No acute intracranial abnormality. Stable mild atrophy and chronic microvascular ischemic changes. Electronically Signed   By: Titus Dubin M.D.   On: 08/01/2017 13:09   Mr Brain  Wo Contrast  Result Date: 08/01/2017 CLINICAL DATA:  Initial evaluation for acute altered mental status. EXAM: MRI HEAD WITHOUT CONTRAST TECHNIQUE: Multiplanar, multiecho pulse sequences of the brain and surrounding structures were obtained without intravenous contrast. COMPARISON:  Prior CT from earlier the same day. FINDINGS: Brain: Generalized age-related cerebral atrophy. Minimal T2/FLAIR hyperintensity within the periventricular white matter, nonspecific, but felt to be within normal limits for age. No abnormal foci of restricted diffusion to suggest acute or subacute ischemia. Gray-white matter differentiation maintained. No evidence for remote cortical infarction. Possible tiny remote left basal ganglia lacunar infarct noted. No evidence for acute intracranial hemorrhage. No mass lesion, midline shift or mass effect. Mild diffuse ventricular prominence related to global parenchymal volume loss of hydrocephalus. No extra-axial fluid collection. Major dural sinuses grossly patent. Pituitary gland suprasellar region normal. Midline structures intact and normal. Vascular: Major intracranial vascular flow voids are maintained. Skull and upper cervical spine: Craniocervical junction normal. Visualized upper cervical spine within normal limits. Bone marrow signal intensity normal. No scalp soft tissue abnormality. Sinuses/Orbits: Globes and orbital soft tissues within normal limits. Patient status post cataract extraction bilaterally. Mild scattered mucosal thickening within the ethmoidal air cells and maxillary sinuses. Right maxillary sinus retention cyst partially visualized. No significant mastoid effusion. Partially visualized parotid glands are atrophic in appearance bilaterally. Other: None. IMPRESSION: Negative brain MRI.  No acute intracranial process identified. Electronically Signed   By: Jeannine Boga M.D.   On: 08/01/2017 23:35   Dg Shoulder Left  Result Date: 08/01/2017 CLINICAL DATA:   Patient has fallen 4 times over the past week. Patient has pain when lifting the left arm since the most recent fall. EXAM: LEFT SHOULDER - 2+ VIEW COMPARISON:  Limited views of the left shoulder from a chest x-ray of July 07, 2016 FINDINGS: The bones of the shoulder are subjectively adequately mineralized. No acute fracture or dislocation is observed. The positioning of the humeral head appears slightly superior with respect to the glenoid which may indicate rotator cuff pathology. IMPRESSION: No acute fracture or dislocation of the left shoulder is observed. I cannot exclude rotator cuff pathology given the slightly superior positioning of the humeral head with respect to the glenoid. Electronically Signed   By: David  Martinique M.D.   On: 08/01/2017 12:41   Ct Maxillofacial Wo Contrast  Result Date: 08/01/2017 CLINICAL DATA:  77 y/o M; multiple falls over last week, nasal fracture suspected. EXAM: CT MAXILLOFACIAL WITHOUT CONTRAST TECHNIQUE: Multidetector CT imaging of the maxillofacial structures was performed. Multiplanar CT image reconstructions were also generated. COMPARISON:  08/01/2017 CT head. FINDINGS: Osseous: Minimally displaced fractures of the left nasal bone and anterior most nasal septum (series 4, image 70 and series 5, image 18). No other facial fracture identified. Orbits: Negative. No  traumatic or inflammatory finding. Sinuses: Right maxillary sinus mucous retention cyst and aerosolized secretions in the right sphenoid sinus. Otherwise negative. Soft tissues: Negative. Limited intracranial: No significant or unexpected finding. IMPRESSION: Minimally displaced fractures of the left nasal bone and anterior most nasal septum. No other fracture identified. Electronically Signed   By: Kristine Garbe M.D.   On: 08/01/2017 17:07    Scheduled Meds: . enoxaparin (LOVENOX) injection  40 mg Subcutaneous Q24H  . furosemide  20 mg Intravenous Once  . levothyroxine  50 mcg Intravenous QAC  breakfast  . mouth rinse  15 mL Mouth Rinse BID    Continuous Infusions: . ampicillin (OMNIPEN) IV       LOS: 0 days     Desiree Hane, MD [facility] Pager [phone]  If 7PM-7AM, please contact night-coverage [email or web address] Password [facility] 08/02/2017, 12:21 PM

## 2017-08-02 NOTE — Progress Notes (Signed)
Pharmacy Antibiotic Note  Jonathon Grimes is a 77 y.o. male staring on Vancomycin, Ceftriaxone and Ampicillin for coverage for sepsis, pneumonia and meningitis.  LP ordered. Spoke with Dr. Lisbeth Ply, who would like to begin antibiotics ASAP, rather than waiting for LP.     Temp 102.3, WBC 12.9. Blood cultures sent.  Neurology would like to also cover for viral meningitis with Acyclovir per pharmacy consult.   Plan: Add Acyclovir 10mg /kg (850mg ) q8h Continue Ceftriaxone 2 gm IV q12hrs. Continue Vancomycin 1gm IV q12hrs. Continue Ampicillin 2 gm IV q4hrs.  Fololw renal function, culture data, clinical progress and plans.  Height: 5\' 8"  (172.7 cm) Weight: 189 lb 2.5 oz (85.8 kg) IBW/kg (Calculated) : 68.4  Temp (24hrs), Avg:100.1 F (37.8 C), Min:98.7 F (37.1 C), Max:102.3 F (39.1 C)  Recent Labs  Lab 08/01/17 1125 08/01/17 1142 08/02/17 0330  WBC 12.9*  --   --   CREATININE 1.23 1.10 1.06    Estimated Creatinine Clearance: 62.2 mL/min (by C-G formula based on SCr of 1.06 mg/dL).    Allergies  Allergen Reactions  . Celebrex [Celecoxib] Other (See Comments)    Unknown    Antimicrobials this admission:  Vancomycin 3/7>>  Ceftriaxone 3/7>>  Ampicillin 3/7>> Acyclovir 3/7 >>  Dose adjustments this admission:  n/a  Microbiology results:  3/7 blood x 2 -   Thank you for allowing pharmacy to be a part of this patient's care.  Sloan Leiter, PharmD, BCPS, BCCCP Clinical Pharmacist Clinical phone 08/02/2017 until 11PM [phone] After hours, please call #[phone] 08/02/2017 7:33 PM

## 2017-08-02 NOTE — Progress Notes (Signed)
Received consult for LP. Patient received lovenox this AM, therefore would be precluded for 12 hours. He is currently being treated empirically with the exception of acyclovir. I will add this and plan to revisit tomorrow.   Roland Rack, MD [facility] [phone]  If 7pm- 7am, please page neurology on call as listed in [place].

## 2017-08-03 DIAGNOSIS — G9341 Metabolic encephalopathy: Principal | ICD-10-CM

## 2017-08-03 LAB — BASIC METABOLIC PANEL
Anion gap: 11 (ref 5–15)
BUN: 30 mg/dL — AB (ref 6–20)
CALCIUM: 8.1 mg/dL — AB (ref 8.9–10.3)
CO2: 23 mmol/L (ref 22–32)
CREATININE: 1.12 mg/dL (ref 0.61–1.24)
Chloride: 106 mmol/L (ref 101–111)
GFR calc Af Amer: >60 mL/min (ref >60)
Glucose, Bld: 123 mg/dL — ABNORMAL HIGH (ref 65–99)
POTASSIUM: 3.4 mmol/L — AB (ref 3.5–5.1)
SODIUM: 140 mmol/L (ref 135–145)

## 2017-08-03 LAB — CBC
HCT: 35.2 % — ABNORMAL LOW (ref 39.0–52.0)
Hemoglobin: 11.7 g/dL — ABNORMAL LOW (ref 13.0–17.0)
MCH: 33.1 pg (ref 26.0–34.0)
MCHC: 33.2 g/dL (ref 30.0–36.0)
MCV: 99.7 fL (ref 78.0–100.0)
PLATELETS: 191 10*3/uL (ref 150–400)
RBC: 3.53 MIL/uL — AB (ref 4.22–5.81)
RDW: 14.1 % (ref 11.5–15.5)
WBC: 8.1 10*3/uL (ref 4.0–10.5)

## 2017-08-03 LAB — GLUCOSE, CAPILLARY
GLUCOSE-CAPILLARY: 113 mg/dL — AB (ref 65–99)
GLUCOSE-CAPILLARY: 119 mg/dL — AB (ref 65–99)

## 2017-08-03 LAB — CSF CELL COUNT WITH DIFFERENTIAL
RBC COUNT CSF: 0 /mm3
Tube #: 3
WBC CSF: 2 /mm3 (ref 0–5)

## 2017-08-03 LAB — PROTEIN AND GLUCOSE, CSF
GLUCOSE CSF: 68 mg/dL (ref 40–70)
TOTAL PROTEIN, CSF: 43 mg/dL (ref 15–45)

## 2017-08-03 LAB — T3, FREE: T3 FREE: 2.6 pg/mL (ref 2.0–4.4)

## 2017-08-03 MED ORDER — LIDOCAINE HCL (PF) 1 % IJ SOLN
INTRAMUSCULAR | Status: AC
  Filled 2017-08-03: qty 5

## 2017-08-03 MED ORDER — POTASSIUM CHLORIDE CRYS ER 20 MEQ PO TBCR
40.0000 meq | EXTENDED_RELEASE_TABLET | Freq: Once | ORAL | Status: AC
  Administered 2017-08-03: 40 meq via ORAL
  Filled 2017-08-03: qty 2

## 2017-08-03 MED ORDER — LIDOCAINE HCL (PF) 1 % IJ SOLN
5.0000 mL | Freq: Once | INTRAMUSCULAR | Status: AC
  Administered 2017-08-03: 5 mL

## 2017-08-03 MED ORDER — STARCH (THICKENING) PO POWD
ORAL | Status: DC | PRN
  Filled 2017-08-03: qty 227

## 2017-08-03 NOTE — Procedures (Signed)
Indication: AMS and fever  Risks of the procedure were dicussed with the patient including post-LP headache, bleeding, infection, weakness/numbness of legs(radiculopathy), death.  The patient's son agreed and written consent was obtained.   The patient was prepped and draped in the sitting position, and using sterile technique a 20 gauge quinke spinal needle was inserted in the L4- 5 space.  Approximately 8 cc of CSF were obtained and sent for analysis.   The patient tolerated the procedure well.   Roland Rack, MD [facility] [phone]  If 7pm- 7am, please page neurology on call as listed in [place].

## 2017-08-03 NOTE — Progress Notes (Signed)
Patient with no evidence of infection on CSF, I would discontinue acyclovir, defer to his continuation of antibacterials to internal medicine.  I suspect that his altered mental status was secondary to physiological stressor in the setting of dementia.  With improvement, no further recommendations at this time, please call with further questions or concerns.  Roland Rack, MD [facility] [phone]  If 7pm- 7am, please page neurology on call as listed in [place].

## 2017-08-03 NOTE — Consult Note (Signed)
            [facility] CM Primary Care Navigator  08/03/2017  OLSEN MCCUTCHAN 1941/02/17 [id number]   Went to see patientat the bedside to identify possible discharge needs. Per MD note, patient was having progressively worsening confusion and patient mentioned that he had a fall that had resulted to this admission.  Patientendorses Dr.Kevin Via with [facility] as hisprimary care provider.He reports that he used to see Dr. Consuello Masse with [place] but had changed to another provider in the past 3 weeks.   [facility] [place] [facility] [address] to obtain medications withoutdifficulty.  Heverbalized that he manages his medications at home with help of his "live-in partner" Bertram Millard).  Patient reports that he has been driving prior to admission but son or "live in partner" can provide transportation to hisdoctors'appointments after discharge.  According to patient, he lives with his live-in partner who will serve as his primary caregiver at home.   Discharge plan is stillundetermined pending PT/ OT evaluation and recommendation. Per PT note, patient continues to have an active bedrest order and plans to attempt to initiate skilled PT evaluation as able and appropriate on 08/04/17.  Explained to patient and he voiced understanding of need to call primary care provider's office whenhe returns backhome, for a post discharge follow-up appointment within1- 2weeksor sooner if needs arise.Patient letter (with PCP's contact number) was provided at the bedside as a reminder and RN was made aware.   Discussed withpatientabout [facility] CM services available forhealthmanagement at home but indicated having no needs or concerns for now. Explained and encouraged patientto seekreferral to [facility] care management from primary care provider if deemed necessary andappropriate for services in the  future.  [facility] care management information provided for future needs thathemay have.    For additional questions please contact:  Edwena Felty A. Desarea Ohagan, BSN, RN-BC [facility] PRIMARY CARE Navigator Cell: [phone]

## 2017-08-03 NOTE — Progress Notes (Signed)
PT Cancellation Note  Patient Details Name: Jonathon Grimes MRN: [id number] DOB: 04/12/1941   Cancelled Treatment:    Reason Eval/Treat Not Completed: Active bedrest order Patient continues to have active bedrest order; in addition, order for PT does not start until 08/04/17. Hold PT today, plan to attempt to initiate skilled PT evaluation as able and appropriate on next date of service.    Deniece Ree PT, DPT, CBIS  Supplemental Physical Therapist [facility]   Pager [phone]

## 2017-08-03 NOTE — Consult Note (Addendum)
Neurology Consultation Reason for Consult: Encephalopathy/LP Referring Physician: Dr. Lonny Prude  CC: Fall  History is obtained from:chart review, patient provides partial history  HPI: Jonathon Grimes is a 77 y.o. 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 Patient reports being told he fell and believes he did hit his head after falling. He does not recollect the events surrounding his falls. Per chart review, he has had confusion for more than a week with worsening agitation.  In the ED, an initial CT head was without acute findings or intracranial hemorrhage. He had a fever of 102.3 F and leukocytosis of 12.9. MRI brain was obtained which did not show signs of acute infarct or other acute process. CT maxillofacial showed minimal displaced fractures of the left nasal bone and anterior nasal septum. Plain xray of the left shoulder was without acute fracture.  He was started on empiric Vanc/Ceftriaxone/Ampicillin for suspected meningitis and Acyclovir was added on yesterday evening. Enoxaparin was given yesterday ~1100 and held for possible LP today.  Today he denies any headache, change in vision, fever, or pain.    ROS: A 14 point ROS was performed and is negative except as noted in the HPI.  Past Medical History:  Diagnosis Date  . Abdominal pain, generalized   . Asbestosis(501)    diagnosed in 2001  . Early satiety   . GERD (gastroesophageal reflux disease)   . Heartburn   . History of colonoscopy    2008 Dr. Anthony Sar and was normal.  . Hypertension   . Hypothyroidism   . Nonspecific elevation of levels of transaminase or lactic acid dehydrogenase (LDH)   . Open wound of finger(s) , without mention of complication     Family History  Problem Relation Age of Onset  . Heart attack Father      Social History:  reports that he has quit smoking. He started smoking about 5 years ago. he has never used smokeless tobacco. He reports that he does not drink alcohol or use drugs.  Exam: Current vital signs: BP (!) 154/74 (BP Location: Right Arm)   Pulse 68   Temp 98.6 F (37 C) (Oral)   Resp 18   Ht 5\' 8"  (1.727 m)   Wt 189 lb 2.5 oz (85.8 kg)   SpO2 96%   BMI 28.76 kg/m  Vital signs in last 24 hours: Temp:  [97.9 F (36.6 C)-99.1 F (37.3 C)] 98.6 F (37 C) (03/08 1628) Pulse Rate:  [68-74] 68 (03/08 1628) Resp:  [18] 18 (03/08 1628) BP: (108-154)/(59-108) 154/74 (03/08 1628) SpO2:  [93 %-99 %] 96 % (03/08 1628)   Physical Exam  Constitutional: Appears well-developed.  Psych: Affect appropriate to situation Eyes: No scleral injection HENT: No OP obstruction Head: Normocephalic.  Cardiovascular: Normal rate and regular rhythm.  Respiratory: Effort normal, non-labored breathing GI: Soft.  No distension. There is no tenderness.  Skin: WDI  Neuro: Mental Status: Patient is awake, alert, oriented to person, place, month, and situation. Says the year is 2018.  Patient is able to give partial history No signs of aphasia  Cranial Nerves: II: Visual Fields testing is limited due to cooperation/understanding vs ? neglect. Pupils are equal, round, and reactive to light.   III,IV, VI: not tracking with EOM testing  V: Facial sensation is symmetric to light touch VII: Facial movement  is symmetric.  VIII: hearing is intact to voice X: Uvula elevates symmetrically XI: Shoulder shrug is decreased on the left. XII: tongue is midline without atrophy or fasciculations.  Motor: Tone is normal. Neck flexion strength 5/5 without nuchal rigidity. Bulk is normal. Strength 3/5 at the RUE at the shoulder. 5/5 strength was present in all other extremities.  Sensory: Sensation is symmetric to light touch in the arms and legs. Deep Tendon Reflexes: 2+ and symmetric in the  patellae.  Plantars: Toes are downgoing bilaterally.  Cerebellar: FNF limited due to understanding/cooperation   I have reviewed labs in epic and the results pertinent to this consultation are: CMP     Component Value Date/Time   NA 140 08/03/2017 0406   NA 139 01/19/2015 1436   K 3.4 (L) 08/03/2017 0406   CL 106 08/03/2017 0406   CO2 23 08/03/2017 0406   GLUCOSE 123 (H) 08/03/2017 0406   BUN 30 (H) 08/03/2017 0406   BUN 16 01/19/2015 1436   CREATININE 1.12 08/03/2017 0406   CREATININE 1.09 09/24/2012 1505   CALCIUM 8.1 (L) 08/03/2017 0406   PROT 6.8 08/02/2017 0330   PROT 7.2 01/19/2015 1436   ALBUMIN 3.7 08/02/2017 0330   ALBUMIN 4.1 01/19/2015 1436   AST 120 (H) 08/02/2017 0330   ALT 24 08/02/2017 0330   ALKPHOS 55 08/02/2017 0330   BILITOT 1.3 (H) 08/02/2017 0330   BILITOT 0.3 01/19/2015 1436   GFRNONAA >60 08/03/2017 0406   GFRAA >60 08/03/2017 0406   CBC    Component Value Date/Time   WBC 8.1 08/03/2017 0406   RBC 3.53 (L) 08/03/2017 0406   HGB 11.7 (L) 08/03/2017 0406   HCT 35.2 (L) 08/03/2017 0406   PLT 191 08/03/2017 0406   MCV 99.7 08/03/2017 0406   MCH 33.1 08/03/2017 0406   MCHC 33.2 08/03/2017 0406   RDW 14.1 08/03/2017 0406   LYMPHSABS 1.6 08/01/2017 1125   MONOABS 0.6 08/01/2017 1125   EOSABS 0.0 08/01/2017 1125   BASOSABS 0.0 08/01/2017 1125   UDS negative UA w/o sign of UTI Acetaminophen <10 Ethanol <10 Ammonia 25 B12 291 TSH 6.772, free T4 0.81, free T3 2.6 HIV non-reactive RPR non-reactive BCx 3/7 >>  I have reviewed the images obtained: CT Head w/o contrast 3/6: No acute intracranial abnormality. Stable mild atrophy and chronic microvascular ischemic changes.  CT maxillofacial w/o contrast 3/6: Minimally displaced fractures of the left nasal bone and anterior most nasal septum. No other fracture identified.  MRI Brain w/o contrast 3/6: Negative brain MRI.  No acute intracranial process identified.  CXR 2V  3/7: Hypoventilatory chest with bronchovascular crowding and bibasilar atelectasis. Possible right pleural effusion.  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   Appears to have some improvement in mental status this morning compared to reported confusion on prior notes suggesting he may be responding to current therapy. Thyroid studies suggest sick euthyroid. B12 is borderline normal.  Recommendations:  LP performed today - sent for cell count, culture, protein, glucose, HSV - CSF studies: WBC 2, RBC 0, Protein 43, Glucose 68, no organisms seen on gram stain  No evidence of CNS infection at this time. Would recommend discontinuing empiric meningitis coverage and treating only other potential infectious process if present. Doubt HSV CNS infection at this time as well and would  discontinue Acyclovir although HSV CSF is still pending. I discussed the CSF results with patient as well as his son, Jhovanny Guinta, by phone [phone]).   Zada Finders, MD Internal Medicine PGY-3
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
## 2017-08-03 NOTE — Progress Notes (Signed)
  Speech Language Pathology Treatment: Dysphagia  Patient Details Name: Jonathon Grimes MRN: [id number] DOB: 28-Jun-1940 Today's Date: 08/03/2017 Time: 4287-6811 SLP Time Calculation (min) (ACUTE ONLY): 12 min  Assessment / Plan / Recommendation Clinical Impression  F/u after yesterday's MBS.  RN reports good toleration bfast/lunch.  Pt consumed nectar-thickened liquids with no overt s/s of aspiration; min cues for rate/bolus size.  Is willing to drink modified liquids.  Confusion persists. LP pending today.  Anticipate rapid improvement in swallow.  HPI HPI: Jonathon Grimes is a 77 y.o. male with medical history significant of dementia, OSA not on CPAP, elevated transaminase, asbestosis, GERD, hypothyroidism, anxiety, who presents with altered mental status and fall.      SLP Plan  Continue with current plan of care       Recommendations  Diet recommendations: Dysphagia 3 (mechanical soft);Nectar-thick liquid Liquids provided via: Cup Medication Administration: Crushed with puree Supervision: Patient able to self feed Compensations: Slow rate;Small sips/bites Postural Changes and/or Swallow Maneuvers: Seated upright 90 degrees                Oral Care Recommendations: Oral care BID Follow up Recommendations: Other (comment)(tba) SLP Visit Diagnosis: Dysphagia, oropharyngeal phase (R13.12) Plan: Continue with current plan of care       GO                Juan Quam Laurice 08/03/2017, 1:26 PM

## 2017-08-03 NOTE — Progress Notes (Signed)
Radiology called to advise that was going to  [facility] guided lumbar puncture cancelled due to was able to be obtained at the bedside.

## 2017-08-04 ENCOUNTER — Inpatient Hospital Stay (HOSPITAL_COMMUNITY): Payer: Medicare Other

## 2017-08-04 LAB — CBC
HEMATOCRIT: 34.8 % — AB (ref 39.0–52.0)
Hemoglobin: 11.3 g/dL — ABNORMAL LOW (ref 13.0–17.0)
MCH: 32.1 pg (ref 26.0–34.0)
MCHC: 32.5 g/dL (ref 30.0–36.0)
MCV: 98.9 fL (ref 78.0–100.0)
Platelets: 195 10*3/uL (ref 150–400)
RBC: 3.52 MIL/uL — ABNORMAL LOW (ref 4.22–5.81)
RDW: 13.7 % (ref 11.5–15.5)
WBC: 6.6 10*3/uL (ref 4.0–10.5)

## 2017-08-04 LAB — BASIC METABOLIC PANEL
Anion gap: 13 (ref 5–15)
BUN: 22 mg/dL — ABNORMAL HIGH (ref 6–20)
CALCIUM: 8.3 mg/dL — AB (ref 8.9–10.3)
CO2: 21 mmol/L — AB (ref 22–32)
CREATININE: 0.97 mg/dL (ref 0.61–1.24)
Chloride: 106 mmol/L (ref 101–111)
GFR calc Af Amer: >60 mL/min (ref >60)
GFR calc non Af Amer: >60 mL/min (ref >60)
GLUCOSE: 135 mg/dL — AB (ref 65–99)
Potassium: 3.6 mmol/L (ref 3.5–5.1)
Sodium: 140 mmol/L (ref 135–145)

## 2017-08-04 MED ORDER — LORAZEPAM 2 MG/ML IJ SOLN
1.0000 mg | INTRAMUSCULAR | Status: DC | PRN
  Administered 2017-08-04: 1 mg via INTRAVENOUS
  Filled 2017-08-04: qty 1

## 2017-08-04 MED ORDER — SODIUM CHLORIDE 0.9% FLUSH: 10.0000 mL | INTRAVENOUS | Status: DC | PRN

## 2017-08-04 MED ORDER — HALOPERIDOL LACTATE 5 MG/ML IJ SOLN
5.0000 mg | Freq: Once | INTRAMUSCULAR | Status: AC
  Administered 2017-08-05: 5 mg via INTRAVENOUS
  Filled 2017-08-04: qty 1

## 2017-08-04 NOTE — Progress Notes (Signed)
Physical Therapy Evaluation Patient Details Name: Jonathon Grimes MRN: [id number] DOB: 03-15-1941 Today's Date: 08/04/2017   History of Present Illness  Mr. Creque is a 77 y/o male admitted on 08/01/17 with altered mental status and a history of falls. Patient with a PMH significant for OSA, GERD, HTN, asbestosis.   Clinical Impression  Pt admitted with above diagnosis. Pt currently with functional limitations due to the deficits listed below (see PT Problem List). Patient very impulsive with all tasks along with some disorientation. Poor historian with no caregiver present to determine true baseline functioning with ADLs and mobility. Today requiring Min guard for transfers and gait for overall safety, as well as education and VC for appropriate use of AD.  Pt will benefit from skilled PT to increase their independence and safety with mobility to allow discharge to the venue listed below.       Follow Up Recommendations Home health PT;Supervision/Assistance - 24 hour;Other (comment)(no family available to determine assistance availability;) will likely need short term SNF placement if supervision is not available     Equipment Recommendations  Rolling walker with 5" wheels    Recommendations for Other Services OT consult     Precautions / Restrictions Precautions Precautions: Fall Restrictions Weight Bearing Restrictions: No      Mobility  Bed Mobility Overal bed mobility: Needs Assistance Bed Mobility: Supine to Sit     Supine to sit: Supervision     General bed mobility comments: impulsive - tries to get out of bed with bed rail up  Transfers Overall transfer level: Needs assistance Equipment used: Rolling walker (2 wheeled) Transfers: Sit to/from Stand Sit to Stand: Min guard         General transfer comment: Min guard for immediate standing balance - VC for hand placement  Ambulation/Gait Ambulation/Gait assistance: Min guard Ambulation Distance (Feet): 120  Feet Assistive device: Rolling walker (2 wheeled) Gait Pattern/deviations: Step-through pattern;Decreased stride length;Trunk flexed   Gait velocity interpretation: Below normal speed for age/gender General Gait Details: reduced directional awareness (R vs L); VC needed for safety with AD   Stairs            Wheelchair Mobility    Modified Rankin (Stroke Patients Only)       Balance Overall balance assessment: Needs assistance Sitting-balance support: No upper extremity supported;Feet supported Sitting balance-Leahy Scale: Fair     Standing balance support: Bilateral upper extremity supported;During functional activity Standing balance-Leahy Scale: Poor                               Pertinent Vitals/Pain Pain Assessment: No/denies pain    Home Living Family/patient expects to be discharged to:: Unsure                 Additional Comments: patient is a poor historian and has no family/caregivers present. Will continue to assess discharge disposition.     Prior Function Level of Independence: Independent               Hand Dominance        Extremity/Trunk Assessment   Upper Extremity Assessment Upper Extremity Assessment: Defer to OT evaluation    Lower Extremity Assessment Lower Extremity Assessment: Generalized weakness       Communication   Communication: No difficulties  Cognition Arousal/Alertness: Awake/alert Behavior During Therapy: Impulsive;Restless Overall Cognitive Status: No family/caregiver present to determine baseline cognitive functioning  General Comments: oriented to person and place; multiple attempts to pull iV line and foley with required redirection      General Comments      Exercises     Assessment/Plan    PT Assessment Patient needs continued PT services  PT Problem List Decreased strength;Decreased balance;Decreased mobility;Decreased knowledge of  use of DME;Decreased safety awareness       PT Treatment Interventions DME instruction;Gait training;Stair training;Functional mobility training;Therapeutic activities;Therapeutic exercise;Neuromuscular re-education;Patient/family education    PT Goals (Current goals can be found in the Care Plan section)  Acute Rehab PT Goals Patient Stated Goal: none stated PT Goal Formulation: With patient Time For Goal Achievement: 08/18/17 Potential to Achieve Goals: Good    Frequency Min 4X/week   Barriers to discharge        Co-evaluation               AM-PAC PT "6 Clicks" Daily Activity  Outcome Measure Difficulty turning over in bed (including adjusting bedclothes, sheets and blankets)?: A Little Difficulty moving from lying on back to sitting on the side of the bed? : A Little Difficulty sitting down on and standing up from a chair with arms (e.g., wheelchair, bedside commode, etc,.)?: Unable Help needed moving to and from a bed to chair (including a wheelchair)?: A Little Help needed walking in hospital room?: A Little Help needed climbing 3-5 steps with a railing? : A Little 6 Click Score: 16    End of Session Equipment Utilized During Treatment: Gait belt Activity Tolerance: Patient tolerated treatment well Patient left: in bed;with call bell/phone within reach;with bed alarm set Nurse Communication: Mobility status PT Visit Diagnosis: Unsteadiness on feet (R26.81);Other abnormalities of gait and mobility (R26.89);Repeated falls (R29.6);Difficulty in walking, not elsewhere classified (R26.2)    Time: 6160-7371 PT Time Calculation (min) (ACUTE ONLY): 22 min   Charges:   PT Evaluation $PT Eval Moderate Complexity: 1 Mod     PT G Codes:        Lanney Gins, PT, DPT 08/04/17 2:36 PM

## 2017-08-04 NOTE — Progress Notes (Signed)
Patient has been confused throughout the shift.  Disoriented x4, trying to get out of bed but is redirectable    RN will continue to monitor patient

## 2017-08-04 NOTE — Progress Notes (Signed)
PROGRESS NOTE  Jonathon Grimes GDJ:[id number] DOB: 01/31/1941 DOA: 08/01/2017 PCP: Manon Hilding, MD  HPI/Recap of past 24 hours:  Jonathon Grimes is a 77 y.o. year old male with medical history significant for dementia with behavioral disturbance, hypothyroidism, OSA not on CPAP, GERD  who presented on 08/01/2017 with worsening confusion and agitation x 1 week and recurrent falls and was found to have acute encephalopathy of unclear etiology with SIRS. CT head and MRI brain were unrevealing.  Patient's clinical status improved significantly with initiation of empiric vancomycin, ampicillin, ceftriaxone, acyclovir.  LP 3/8 was unrevealing for any signs of infection.  Acyclovir was discontinued on 3/8  Interval History Continues to do very well today.  Able to have conversation with me.  Denies any problems with breathing.  Endorses a cough.  Reports some mild left shoulder pain.  Assessment/Plan: Principal Problem:   Acute metabolic encephalopathy Active Problems:   Transaminasemia   Dementia   Hypothyroid   OSA (obstructive sleep apnea)   Fall   Nasal bone fracture   Leukocytosis   # Acute encephalopathy, improving LP unrevealing Blood cultures have remained negative (less concern for MRSA, so need to continue gentamicin) Metabolic workup negative so far (B12 wnl, negative UDS, UA neg, acetaminophen level wnl, ammonia/ethanol wnl, glucose wnl) Blood cultures NGTD, RPR and HIV wnl MRI/CT head unrevealing I suspect this may be related to potential infection most likely pneumonia given her story symptoms.  Given patient is more cooperative with improvement mental status will repeat chest x-ray -Discontinue acyclovir and ampicillin given low likelihood of meningitis -Discontinue vancomycin -Appreciate neurology recommendations -avoid sedating medications  #SIRS, suspect pneumonia Leukocytosis improved and without fever for 48 hours  Last chest x-ray unhelpful given poor effort,  Pleural effusion could be hiding potential pna UA negative, no localizing signs or symptoms otherwise LP negative -Will repeat chest x-ray given improvement in mental status and ability to cooperate - empiric antibiotics as mentioned above - f/u blood cultures - tylenol PRN  #R Pleural effusion, stable Better respiratory effort  Audible crackles as well though no other signs of volume overload may benefit from diuresis No supplemental oxygen needed No known history of CHF. BNP 60 - s/p IV lasix s 1 on 3/7will try IV lasix now - consider thoracentesis if no improvement  #Elevated AST, chronic in nature Ammonia, plt, INR all normal t bili 1.5 Tylenol level and hepatitis panelwnl No known liver pathology No abd pain on exam -continue to monitor  #Dementia, with history of behavioral disturbance -not taking home aricept - PT eval and treat   #Hypothyroidism TSH 6.7, likely not adherent at home given high value or may need increase home dose if he is adherent - continue home dose synthroid  #Recurrent falls In setting in worsening confusion No acute abnormalities in CT head imaging Metabolic workup as mentioned above negative -PT/OT eval and treat  Code Status: Full   Family Communication: Will phone call [place]  Disposition Plan: Repeat chest x-ray, narrow antibiotics, follow-up PT OT evaluation  Consultants:  None   Procedures:  Plan for LP   Antimicrobials:  3/7: vancomycin-3/9  3/7 ampicillin-3/9  3/7 ceftriaxone-3/9  3/7 acyclovir-3/9  Cultures:  3/7 blood -NGTD  Telemetry:Yes  DVT prophylaxis:  lovenox   Objective: Vitals:   08/04/17 0053 08/04/17 0400 08/04/17 0803 08/04/17 1200  BP: 132/75 (!) 144/98 137/79 132/71  Pulse: 64 64 68 66  Resp: 18 18 18 20   Temp: 100 F (37.8  C) 98.9 F (37.2 C) 98.7 F (37.1 C) 98.8 F (37.1 C)  TempSrc: Oral Oral Oral Oral  SpO2: 95% 95% 96% 97%  Weight:      Height:        Intake/Output  Summary (Last 24 hours) at 08/04/2017 1430 Last data filed at 08/03/2017 1647 Gross per 24 hour  Intake -  Output 800 ml  Net -800 ml   Filed Weights   08/01/17 1124 08/02/17 0230  Weight: 90.7 kg (200 lb) 85.8 kg (189 lb 2.5 oz)    Exam:  General: comfortably Lying in bed,no apparent distress Eyes: EOMI, anicteric Cardiovascular: regular rate and rhythm, no murmurs, rubs or gallops, no edema Respiratory: Normal respiratory effort on room air, crackles at bases greater on right side Abdomen: soft, non-distended, non-tender, normal bowel sounds Skin: No Rash Musculoskeletal: Left upper arm pain with extension of the arm.  Neurologic: Alert, oriented to self, place([place]), time (year and month) disease, follows commands.  Grossly no focal neuro deficit.   Data Reviewed: CBC: Recent Labs  Lab 08/01/17 1125 08/01/17 1142 08/03/17 0406 08/04/17 0424  WBC 12.9*  --  8.1 6.6  NEUTROABS 10.7*  --   --   --   HGB 13.4 14.3 11.7* 11.3*  HCT 40.1 42.0 35.2* 34.8*  MCV 99.3  --  99.7 98.9  PLT 201  --  191 440   Basic Metabolic Panel: Recent Labs  Lab 08/01/17 1125 08/01/17 1142 08/02/17 0330 08/03/17 0406 08/04/17 0424  NA 141 143 139 140 140  K 4.4 4.3 3.7 3.4* 3.6  CL 107 106 107 106 106  CO2 24  --  21* 23 21*  GLUCOSE 100* 95 92 123* 135*  BUN 25* 29* 28* 30* 22*  CREATININE 1.23 1.10 1.06 1.12 0.97  CALCIUM 9.1  --  8.5* 8.1* 8.3*   GFR: Estimated Creatinine Clearance: 68 mL/min (by C-G formula based on SCr of 0.97 mg/dL). Liver Function Tests: Recent Labs  Lab 08/01/17 1125 08/02/17 0330  AST 127* 120*  ALT 26 24  ALKPHOS 60 55  BILITOT 1.5* 1.3*  PROT 8.4* 6.8  ALBUMIN 4.2 3.7   No results for input(s): LIPASE, AMYLASE in the last 168 hours. Recent Labs  Lab 08/01/17 2108  AMMONIA 25   Coagulation Profile: Recent Labs  Lab 08/01/17 1125  INR 1.06   Cardiac Enzymes: No results for input(s): CKTOTAL, CKMB, CKMBINDEX, TROPONINI in the  last 168 hours. BNP (last 3 results) No results for input(s): PROBNP in the last 8760 hours. HbA1C: No results for input(s): HGBA1C in the last 72 hours. CBG: Recent Labs  Lab 08/03/17 0621 08/03/17 0820  GLUCAP 113* 119*   Lipid Profile: No results for input(s): CHOL, HDL, LDLCALC, TRIG, CHOLHDL, LDLDIRECT in the last 72 hours. Thyroid Function Tests: Recent Labs    08/02/17 0330 08/02/17 0832  TSH 6.772*  --   FREET4  --  0.81  T3FREE  --  2.6   Anemia Panel: Recent Labs    08/02/17 0330  VITAMINB12 291  FOLATE 5.0*   Urine analysis:    Component Value Date/Time   COLORURINE YELLOW 08/01/2017 1847   APPEARANCEUR CLEAR 08/01/2017 1847   LABSPEC 1.027 08/01/2017 1847   PHURINE 5.0 08/01/2017 1847   GLUCOSEU NEGATIVE 08/01/2017 1847   HGBUR NEGATIVE 08/01/2017 1847   BILIRUBINUR NEGATIVE 08/01/2017 1847   KETONESUR 20 (A) 08/01/2017 1847   PROTEINUR 30 (A) 08/01/2017 1847   NITRITE NEGATIVE 08/01/2017 1847  LEUKOCYTESUR NEGATIVE 08/01/2017 1847   Sepsis Labs: @LABRCNTIP (procalcitonin:4,lacticidven:4)  ) Recent Results (from the past 240 hour(s))  Culture, blood (Routine X 2) w Reflex to ID Panel     Status: None (Preliminary result)   Collection Time: 08/02/17  3:30 AM  Result Value Ref Range Status   Specimen Description BLOOD RIGHT WRIST  Final   Special Requests AEROBIC BOTTLE ONLY Blood Culture adequate volume  Final   Culture   Final    NO GROWTH 2 DAYS Performed at [facility], [address], [place], [place] [address]    Report Status PENDING  Incomplete  Culture, blood (Routine X 2) w Reflex to ID Panel     Status: None (Preliminary result)   Collection Time: 08/02/17  3:38 AM  Result Value Ref Range Status   Specimen Description BLOOD RIGHT ANTECUBITAL  Final   Special Requests AEROBIC BOTTLE ONLY Blood Culture adequate volume  Final   Culture   Final    NO GROWTH 2 DAYS Performed at [facility], [place] [address]  [place], [place] [address]    Report Status PENDING  Incomplete  CSF culture     Status: None (Preliminary result)   Collection Time: 08/03/17  1:33 PM  Result Value Ref Range Status   Specimen Description CSF  Final   Special Requests TUBE 4  Final   Gram Stain   Final    WBC PRESENT, PREDOMINANTLY MONONUCLEAR NO ORGANISMS SEEN    Culture   Final    NO GROWTH 1 DAY Performed at [facility], [place] [address] [place], [place] [address]    Report Status PENDING  Incomplete      Studies: No results found.  Scheduled Meds: . enoxaparin (LOVENOX) injection  40 mg Subcutaneous Q24H  . levothyroxine  50 mcg Intravenous QAC breakfast  . mouth rinse  15 mL Mouth Rinse BID    Continuous Infusions: . ampicillin (OMNIPEN) IV Stopped (08/04/17 1428)  . cefTRIAXone (ROCEPHIN)  IV Stopped (08/04/17 0524)  . vancomycin 1,000 mg (08/04/17 1429)     LOS: 2 days     Desiree Hane, MD [facility] Pager [phone]  If 7PM-7AM, please contact night-coverage [email or web address] Password [email or web address] 08/04/2017, 2:30 PM

## 2017-08-05 LAB — HERPES SIMPLEX VIRUS(HSV) DNA BY PCR
HSV 1 DNA: NEGATIVE
HSV 2 DNA: NEGATIVE

## 2017-08-05 LAB — CBC
HCT: 35.9 % — ABNORMAL LOW (ref 39.0–52.0)
HEMOGLOBIN: 11.7 g/dL — AB (ref 13.0–17.0)
MCH: 32.6 pg (ref 26.0–34.0)
MCHC: 32.6 g/dL (ref 30.0–36.0)
MCV: 100 fL (ref 78.0–100.0)
Platelets: 219 10*3/uL (ref 150–400)
RBC: 3.59 MIL/uL — AB (ref 4.22–5.81)
RDW: 13.9 % (ref 11.5–15.5)
WBC: 6.1 10*3/uL (ref 4.0–10.5)

## 2017-08-05 LAB — BASIC METABOLIC PANEL
Anion gap: 9 (ref 5–15)
BUN: 18 mg/dL (ref 6–20)
CHLORIDE: 108 mmol/L (ref 101–111)
CO2: 23 mmol/L (ref 22–32)
Calcium: 8.4 mg/dL — ABNORMAL LOW (ref 8.9–10.3)
Creatinine, Ser: 0.95 mg/dL (ref 0.61–1.24)
GFR calc Af Amer: >60 mL/min (ref >60)
GFR calc non Af Amer: >60 mL/min (ref >60)
GLUCOSE: 98 mg/dL (ref 65–99)
POTASSIUM: 3.8 mmol/L (ref 3.5–5.1)
Sodium: 140 mmol/L (ref 135–145)

## 2017-08-05 MED ORDER — LEVOTHYROXINE SODIUM 100 MCG PO TABS
100.0000 ug | ORAL_TABLET | Freq: Every day | ORAL | Status: DC
  Administered 2017-08-06: 100 ug via ORAL
  Filled 2017-08-05: qty 1
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
## 2017-08-05 NOTE — Progress Notes (Addendum)
PROGRESS NOTE  Jonathon Grimes UKG:[id number] DOB: 1941/03/23 DOA: 08/01/2017 PCP: Manon Hilding, MD  HPI/Recap of past 24 hours:  Jonathon Grimes is a 77 y.o. year old male with medical history significant for dementia with behavioral disturbance, hypothyroidism, OSA not on CPAP, GERD  who presented on 08/01/2017 with worsening confusion and agitation x 1 week and recurrent falls and was found to have acute encephalopathy of unclear etiology with SIRS. CT head and MRI brain were unrevealing.  Patient's clinical status improved significantly with initiation of empiric vancomycin, ampicillin, ceftriaxone, acyclovir.  LP 3/8 was unrevealing for any signs of infection.  Acyclovir was discontinued on 3/8  Interval History Has some bouts of confusion overnight, however today has no acute complaints.  Assessment/Plan: Principal Problem:   Acute metabolic encephalopathy Active Problems:   Transaminasemia   Dementia   Hypothyroid   OSA (obstructive sleep apnea)   Fall   Nasal bone fracture   Leukocytosis   # Acute encephalopathy, improving Likely acute behavior disturbance in a patient with known dementia.  Unclear nidus however infectious/metabolic/neurologic workup has remained negative Patient has remained afebrile.  Repeat chest x-ray showed no concern for effusion or pneumonia and patient not requiring oxygen LP, blood cultures, MRI/CT head, RPR/HIV, metabolic workup as mentioned in previous progress notes unrevealing -given negative blood cultures and no localizing infections will discontinue empiric antibiotics and monitor to ensure no hidden infectious source -avoid sedating medications, delirium precautions -Medically stable awaiting SNF placement, family in agreement  #SIRS, resolved Leukocytosis improved has remained afebrile off all antibiotics over the past 24 hours LP and blood cultures have also been unrevealing, less likely infectious etiology Repeat chest x-ray showed no  concern for pneumonia UA negative, no localizing signs or symptoms otherwise LP negative -Continue to follow blood cultures   #R Pleural effusion, resolved Repeat chest x-ray shows no pleural effusion Respiratory status normal today, not requiring any oxygen No known history of CHF. BNP 60 - s/p IV lasix s 1 on 3/7, no thoracentesis was necessary  #Elevated AST, chronic in nature Ammonia, plt, INR all normal t bili 1.5 Tylenol level and hepatitis panelwnl No known liver pathology No abd pain on exam -continue to monitor  #Dementia, with history of behavioral disturbance -not taking home aricept - PT recommends SNF  #Hypothyroidism TSH 6.7, likely not adherent at home given high value or may need increase home dose if he is adherent - continue home dose synthroid  #Recurrent falls In setting in worsening confusion No acute abnormalities in CT head imaging Metabolic workup as mentioned above negative -PT recommends sniff  Code Status: Full   Family Communication: Will continue discussion with Ruby Nicholes  Disposition Plan: Medically stable, awaiting placement to SNF  Consultants:  Neurology (signed off close (  Procedures:  LP on 3/8  Antimicrobials:  3/7: vancomycin-3/9  3/7 ampicillin-3/9  3/7 ceftriaxone-3/9  3/7 acyclovir-3/9  Cultures:  3/7 blood -NGTD  CSF culture-3 8-negative growth  Telemetry:Yes  DVT prophylaxis:  lovenox   Objective: Vitals:   08/04/17 1635 08/04/17 2044 08/05/17 0456 08/05/17 0900  BP: 129/68 125/71 (!) 107/44 132/77  Pulse: 71 70 (!) 57 63  Resp: 18 18 20 18   Temp: 98.6 F (37 C) 99.3 F (37.4 C) 98.6 F (37 C) 98.4 F (36.9 C)  TempSrc: Oral Oral Axillary Oral  SpO2: 97% 94% 95% 94%  Weight:      Height:        Intake/Output Summary (Last 24  hours) at 08/05/2017 1216 Last data filed at 08/04/2017 1813 Gross per 24 hour  Intake 300 ml  Output 400 ml  Net -100 ml   Filed Weights   08/01/17 1124  08/02/17 0230  Weight: 90.7 kg (200 lb) 85.8 kg (189 lb 2.5 oz)    Exam:  General: comfortably Lying in bed,no apparent distress Eyes: EOMI, anicteric Cardiovascular: regular rate and rhythm, no murmurs, rubs or gallops, no edema Respiratory: Normal respiratory effort on room air, clear breath sounds on auscultation bilaterally Skin: No Rash Musculoskeletal: Left upper arm pain with extension of the arm.  Neurologic: Alert, oriented to self, place(), time (year and month) context, follows commands.  Grossly no focal neuro deficit.   Data Reviewed: CBC: Recent Labs  Lab 08/01/17 1125 08/01/17 1142 08/03/17 0406 08/04/17 0424 08/05/17 0542  WBC 12.9*  --  8.1 6.6 6.1  NEUTROABS 10.7*  --   --   --   --   HGB 13.4 14.3 11.7* 11.3* 11.7*  HCT 40.1 42.0 35.2* 34.8* 35.9*  MCV 99.3  --  99.7 98.9 100.0  PLT 201  --  191 195 700   Basic Metabolic Panel: Recent Labs  Lab 08/01/17 1125 08/01/17 1142 08/02/17 0330 08/03/17 0406 08/04/17 0424 08/05/17 0542  NA 141 143 139 140 140 140  K 4.4 4.3 3.7 3.4* 3.6 3.8  CL 107 106 107 106 106 108  CO2 24  --  21* 23 21* 23  GLUCOSE 100* 95 92 123* 135* 98  BUN 25* 29* 28* 30* 22* 18  CREATININE 1.23 1.10 1.06 1.12 0.97 0.95  CALCIUM 9.1  --  8.5* 8.1* 8.3* 8.4*   GFR: Estimated Creatinine Clearance: 69.4 mL/min (by C-G formula based on SCr of 0.95 mg/dL). Liver Function Tests: Recent Labs  Lab 08/01/17 1125 08/02/17 0330  AST 127* 120*  ALT 26 24  ALKPHOS 60 55  BILITOT 1.5* 1.3*  PROT 8.4* 6.8  ALBUMIN 4.2 3.7   No results for input(s): LIPASE, AMYLASE in the last 168 hours. Recent Labs  Lab 08/01/17 2108  AMMONIA 25   Coagulation Profile: Recent Labs  Lab 08/01/17 1125  INR 1.06   Cardiac Enzymes: No results for input(s): CKTOTAL, CKMB, CKMBINDEX, TROPONINI in the last 168 hours. BNP (last 3 results) No results for input(s): PROBNP in the last 8760 hours. HbA1C: No results for input(s):  HGBA1C in the last 72 hours. CBG: Recent Labs  Lab 08/03/17 0621 08/03/17 0820  GLUCAP 113* 119*   Lipid Profile: No results for input(s): CHOL, HDL, LDLCALC, TRIG, CHOLHDL, LDLDIRECT in the last 72 hours. Thyroid Function Tests: No results for input(s): TSH, T4TOTAL, FREET4, T3FREE, THYROIDAB in the last 72 hours. Anemia Panel: No results for input(s): VITAMINB12, FOLATE, FERRITIN, TIBC, IRON, RETICCTPCT in the last 72 hours. Urine analysis:    Component Value Date/Time   COLORURINE YELLOW 08/01/2017 1847   APPEARANCEUR CLEAR 08/01/2017 1847   LABSPEC 1.027 08/01/2017 1847   PHURINE 5.0 08/01/2017 1847   GLUCOSEU NEGATIVE 08/01/2017 1847   HGBUR NEGATIVE 08/01/2017 1847   BILIRUBINUR NEGATIVE 08/01/2017 1847   KETONESUR 20 (A) 08/01/2017 1847   PROTEINUR 30 (A) 08/01/2017 1847   NITRITE NEGATIVE 08/01/2017 1847   LEUKOCYTESUR NEGATIVE 08/01/2017 1847   Sepsis Labs: @LABRCNTIP (procalcitonin:4,lacticidven:4)  ) Recent Results (from the past 240 hour(s))  Culture, blood (Routine X 2) w Reflex to ID Panel     Status: None (Preliminary result)   Collection Time: 08/02/17  3:30 AM  Result Value Ref Range Status   Specimen Description BLOOD RIGHT WRIST  Final   Special Requests AEROBIC BOTTLE ONLY Blood Culture adequate volume  Final   Culture   Final    NO GROWTH 2 DAYS Performed at [facility], [address] [place], [place] [address]    Report Status PENDING  Incomplete  Culture, blood (Routine X 2) w Reflex to ID Panel     Status: None (Preliminary result)   Collection Time: 08/02/17  3:38 AM  Result Value Ref Range Status   Specimen Description BLOOD RIGHT ANTECUBITAL  Final   Special Requests AEROBIC BOTTLE ONLY Blood Culture adequate volume  Final   Culture   Final    NO GROWTH 2 DAYS Performed at [facility], [place] [address], [place], [place] [address]    Report Status PENDING  Incomplete  CSF culture     Status: None (Preliminary result)    Collection Time: 08/03/17  1:33 PM  Result Value Ref Range Status   Specimen Description CSF  Final   Special Requests TUBE 4  Final   Gram Stain   Final    WBC PRESENT, PREDOMINANTLY MONONUCLEAR NO ORGANISMS SEEN    Culture   Final    NO GROWTH 2 DAYS Performed at [facility], [place] [address] [place], [place] [address]    Report Status PENDING  Incomplete      Studies: Dg Chest 2 View  Result Date: 08/04/2017 CLINICAL DATA:  Encounter for cough, SIRS EXAM: CHEST - 2 VIEW COMPARISON:  Chest x-rays dated 08/03/2007, 07/17/2016 and 02/10/2016. FINDINGS: Study is slightly hypoinspiratory. Given the low lung volumes, lungs are clear. Heart size and mediastinal contours are stable. No pleural effusion or pneumothorax. Osseous structures about the chest are unremarkable. IMPRESSION: No active cardiopulmonary disease. No evidence of pneumonia or pulmonary edema. Electronically Signed   By: Franki Cabot M.D.   On: 08/04/2017 16:05    Scheduled Meds: . enoxaparin (LOVENOX) injection  40 mg Subcutaneous Q24H  . levothyroxine  50 mcg Intravenous QAC breakfast  . mouth rinse  15 mL Mouth Rinse BID    Continuous Infusions: . ampicillin (OMNIPEN) IV 2 g (08/05/17 1047)  . cefTRIAXone (ROCEPHIN)  IV Stopped (08/05/17 0433)     LOS: 3 days     Desiree Hane, MD [facility] Pager [phone]  If 7PM-7AM, please contact night-coverage [email or web address] Password [email or web address] 08/05/2017, 12:16 PM
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
## 2017-08-06 DIAGNOSIS — W19XXXD Unspecified fall, subsequent encounter: Secondary | ICD-10-CM | POA: Diagnosis not present

## 2017-08-06 DIAGNOSIS — F0151 Vascular dementia with behavioral disturbance: Secondary | ICD-10-CM | POA: Diagnosis not present

## 2017-08-06 DIAGNOSIS — E039 Hypothyroidism, unspecified: Secondary | ICD-10-CM | POA: Diagnosis not present

## 2017-08-06 DIAGNOSIS — G3281 Cerebellar ataxia in diseases classified elsewhere: Secondary | ICD-10-CM | POA: Diagnosis not present

## 2017-08-06 DIAGNOSIS — R1312 Dysphagia, oropharyngeal phase: Secondary | ICD-10-CM | POA: Diagnosis not present

## 2017-08-06 DIAGNOSIS — F0391 Unspecified dementia with behavioral disturbance: Secondary | ICD-10-CM | POA: Diagnosis not present

## 2017-08-06 DIAGNOSIS — R262 Difficulty in walking, not elsewhere classified: Secondary | ICD-10-CM | POA: Diagnosis not present

## 2017-08-06 DIAGNOSIS — R41841 Cognitive communication deficit: Secondary | ICD-10-CM | POA: Diagnosis not present

## 2017-08-06 DIAGNOSIS — F259 Schizoaffective disorder, unspecified: Secondary | ICD-10-CM | POA: Diagnosis not present

## 2017-08-06 DIAGNOSIS — R2681 Unsteadiness on feet: Secondary | ICD-10-CM | POA: Diagnosis not present

## 2017-08-06 DIAGNOSIS — G4733 Obstructive sleep apnea (adult) (pediatric): Secondary | ICD-10-CM | POA: Diagnosis not present

## 2017-08-06 DIAGNOSIS — G9341 Metabolic encephalopathy: Secondary | ICD-10-CM | POA: Diagnosis not present

## 2017-08-06 DIAGNOSIS — R32 Unspecified urinary incontinence: Secondary | ICD-10-CM | POA: Diagnosis not present

## 2017-08-06 DIAGNOSIS — I10 Essential (primary) hypertension: Secondary | ICD-10-CM | POA: Diagnosis not present

## 2017-08-06 DIAGNOSIS — S022XXG Fracture of nasal bones, subsequent encounter for fracture with delayed healing: Secondary | ICD-10-CM | POA: Diagnosis not present

## 2017-08-06 DIAGNOSIS — R740 Nonspecific elevation of levels of transaminase and lactic acid dehydrogenase [LDH]: Secondary | ICD-10-CM | POA: Diagnosis not present

## 2017-08-06 DIAGNOSIS — R39198 Other difficulties with micturition: Secondary | ICD-10-CM | POA: Diagnosis not present

## 2017-08-06 DIAGNOSIS — F411 Generalized anxiety disorder: Secondary | ICD-10-CM | POA: Diagnosis not present

## 2017-08-06 DIAGNOSIS — F0390 Unspecified dementia without behavioral disturbance: Secondary | ICD-10-CM | POA: Diagnosis not present

## 2017-08-06 DIAGNOSIS — K219 Gastro-esophageal reflux disease without esophagitis: Secondary | ICD-10-CM | POA: Diagnosis not present

## 2017-08-06 DIAGNOSIS — M47812 Spondylosis without myelopathy or radiculopathy, cervical region: Secondary | ICD-10-CM | POA: Diagnosis not present

## 2017-08-06 DIAGNOSIS — R4689 Other symptoms and signs involving appearance and behavior: Secondary | ICD-10-CM | POA: Diagnosis not present

## 2017-08-06 DIAGNOSIS — G4752 REM sleep behavior disorder: Secondary | ICD-10-CM | POA: Diagnosis not present

## 2017-08-06 DIAGNOSIS — M6281 Muscle weakness (generalized): Secondary | ICD-10-CM | POA: Diagnosis not present

## 2017-08-06 LAB — CBC
HCT: 37.5 % — ABNORMAL LOW (ref 39.0–52.0)
Hemoglobin: 12.3 g/dL — ABNORMAL LOW (ref 13.0–17.0)
MCH: 32.7 pg (ref 26.0–34.0)
MCHC: 32.8 g/dL (ref 30.0–36.0)
MCV: 99.7 fL (ref 78.0–100.0)
PLATELETS: 258 10*3/uL (ref 150–400)
RBC: 3.76 MIL/uL — ABNORMAL LOW (ref 4.22–5.81)
RDW: 13.9 % (ref 11.5–15.5)
WBC: 6.7 10*3/uL (ref 4.0–10.5)

## 2017-08-06 LAB — CSF CULTURE W GRAM STAIN: Culture: NO GROWTH

## 2017-08-06 MED ORDER — ASPIRIN 81 MG PO CHEW: 81.0000 mg | CHEWABLE_TABLET | Freq: Every day | ORAL | Status: AC

## 2017-08-06 MED ORDER — LEVOTHYROXINE SODIUM 100 MCG PO TABS: 100.0000 ug | ORAL_TABLET | Freq: Every day | ORAL | Status: AC

## 2017-08-06 MED ORDER — ORAL CARE MOUTH RINSE: 15.0000 mL | Freq: Two times a day (BID) | OROMUCOSAL | 0 refills | Status: AC

## 2017-08-06 MED ORDER — VITAMIN D (ERGOCALCIFEROL) 1.25 MG (50000 UNIT) PO CAPS: 50000.0000 [IU] | ORAL_CAPSULE | ORAL | 0 refills | Status: AC

## 2017-08-06 MED ORDER — CALCIUM CITRATE-VITAMIN D 250-100 MG-UNIT PO TABS: 1.0000 | ORAL_TABLET | Freq: Every day | ORAL | Status: AC

## 2017-08-06 MED ORDER — STARCH (THICKENING) PO POWD: ORAL | 0 refills | Status: AC

## 2017-08-06 NOTE — Care Management Important Message (Signed)
Important Message  Patient Details  Name: Jonathon Grimes MRN: [id number] Date of Birth: Oct 09, 1940   Medicare Important Message Given:  Yes    Ambers Iyengar Montine Circle 08/06/2017, 12:52 PM
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
## 2017-08-06 NOTE — NC FL2 (Signed)
[place] LEVEL OF CARE SCREENING TOOL     IDENTIFICATION  Patient Name: Jonathon Grimes Birthdate: 23-Feb-1941 Sex: male Admission Date (Current Location): 08/01/2017  [facility] and [place] Number:  Herbalist and Address:  [facility] [place], [address], [place], [place] [address]      Provider Number: [id number]  Attending Physician Name and Address:  No att. providers found  Relative Name and Phone Number:       Current Level of Care: Hospital Recommended Level of Care: [place] Prior Approval Number:    Date Approved/Denied:   PASRR Number: [id number]  Discharge Plan: SNF    Current Diagnoses: Patient Active Problem List   Diagnosis Date Noted  . Fall 08/02/2017  . Acute metabolic encephalopathy 85/27/7824  . Nasal bone fracture 08/02/2017  . Leukocytosis 08/02/2017  . B12 deficiency 11/13/2016  . Balance problems 11/13/2016  . OSA (obstructive sleep apnea) 04/28/2015  . Low serum vitamin D 03/24/2015  . Dementia 01/19/2015  . REM sleep behavior disorder 01/19/2015  . Hypothyroid 01/19/2015  . Snoring 01/19/2015  . Excessive daytime sleepiness 01/19/2015  . Transaminasemia 03/26/2012  . GERD (gastroesophageal reflux disease) 09/25/2011  . Asbestosis(501) 09/25/2011    Orientation RESPIRATION BLADDER Height & Weight     Self, Situation, Place  Normal Continent Weight: 189 lb 2.5 oz (85.8 kg) Height:  5\' 8"  (172.7 cm)  BEHAVIORAL SYMPTOMS/MOOD NEUROLOGICAL BOWEL NUTRITION STATUS      Continent Diet(heart healthy)  AMBULATORY STATUS COMMUNICATION OF NEEDS Skin   Limited Assist Verbally Normal                       Personal Care Assistance Level of Assistance  Bathing, Feeding, Dressing Bathing Assistance: Limited assistance Feeding assistance: Independent Dressing Assistance: Limited assistance     Functional Limitations Info  Sight, Hearing, Speech Sight Info: Adequate Hearing Info:  Adequate Speech Info: Adequate    SPECIAL CARE FACTORS FREQUENCY  PT (By licensed PT), OT (By licensed OT)     PT Frequency: 5x/wk OT Frequency: 5x/wk            Contractures Contractures Info: Not present    Additional Factors Info  Code Status, Allergies Code Status Info: Full Allergies Info: Celebrex           Current Medications (08/06/2017):  This is the current hospital active medication list Current Facility-Administered Medications  Medication Dose Route Frequency Provider Last Rate Last Dose  . acetaminophen (TYLENOL) tablet 650 mg  650 mg Oral Q6H PRN Oretha Milch D, MD   650 mg at 08/04/17 2023  . enoxaparin (LOVENOX) injection 40 mg  40 mg Subcutaneous Q24H Greta Doom, MD   40 mg at 08/06/17 1026  . food thickener (THICK IT) powder   Oral PRN Oretha Milch D, MD      . hydrALAZINE (APRESOLINE) injection 5 mg  5 mg Intravenous Q2H PRN Ivor Costa, MD      . levothyroxine (SYNTHROID, LEVOTHROID) tablet 100 mcg  100 mcg Oral QAC breakfast Oretha Milch D, MD   100 mcg at 08/06/17 0649  . LORazepam (ATIVAN) injection 1 mg  1 mg Intravenous Q4H PRN Bodenheimer, Charles A, NP   1 mg at 08/04/17 2340  . MEDLINE mouth rinse  15 mL Mouth Rinse BID Ivor Costa, MD   15 mL at 08/05/17 1049  . ondansetron (ZOFRAN) tablet 4 mg  4 mg Oral Q6H PRN  Ivor Costa, MD       Or  . ondansetron [facility]) injection 4 mg  4 mg Intravenous Q6H PRN Ivor Costa, MD      . sodium chloride flush (NS) 0.9 % injection 10-40 mL  10-40 mL Intracatheter PRN Desiree Hane, MD       Current Outpatient Medications  Medication Sig Dispense Refill  . aspirin 81 MG chewable tablet Chew 1 tablet (81 mg total) by mouth daily.    . calcium-vitamin D 250-100 MG-UNIT tablet Take 1 tablet by mouth daily.    . food thickener (THICK IT) POWD Use as need with meals  0  . levothyroxine (SYNTHROID, LEVOTHROID) 100 MCG tablet Take 1 tablet (100 mcg total) by mouth daily before breakfast.    .  mouth rinse LIQD solution 15 mLs by Mouth Rinse route 2 (two) times daily.  0  . Vitamin D, Ergocalciferol, (DRISDOL) 50000 units CAPS capsule Take 1 capsule (50,000 Units total) by mouth every 7 (seven) days. Once done with prescription, take over the counter Vitamin D 2,000u once daily 12 capsule 0     Discharge Medications: Please see discharge summary for a list of discharge medications.  Relevant Imaging Results:  Relevant Lab Results:   Additional Information SS#: [id number]  Geralynn Ochs, LCSW

## 2017-08-06 NOTE — Progress Notes (Signed)
Pt being discharged from hospital per orders from MD. Pt and family educated on discharge instructions. Pt and family verbalized understanding of instructions. All questions and concerns were addressed. Pt's IV was removed prior to discharge. Pt exited hospital via wheelchair accompanied by staff.

## 2017-08-06 NOTE — Progress Notes (Addendum)
CSW sent out referral to facilities in patient's home location. CSW contacted patient's son's first choice facility, [facility], to discuss referral and potential to offer a bed. [facility] is not able to offer a bed at this time due to the patient's aggressive behaviors. CSW to check with other [place], [facility], to determine bed offer, and will update when information available.  Laveda Abbe, [place] Clinical Social Worker [phone]    UPDATE 12:15 PM:  Patient has received bed offer from [facility]. CSW contacted patient's son via phone to discuss; patient's son accepts bed offer. CSW gave patient's son's contact information to the son, who will discuss admission paperwork. CSW alerted MD. Patient is stable for discharge.  CSW will continue to follow.  Laveda Abbe, [place] Clinical Social Worker [phone]

## 2017-08-06 NOTE — Discharge Summary (Signed)
Discharge Summary  Jonathon Grimes HUD:[id number] DOB: 05/28/1941  PCP: Jonathon Hilding, MD  Admit date: 08/01/2017 Discharge date: 08/06/2017  Time spent: < 25 minutes  Admitted From: Home Disposition: SNF  Recommendations for Outpatient Follow-up:  1. Follow up with PCP in 1-2 weeks.  Follow-up TSH in 4-6 weeks   Discharge Diagnoses:  Active Hospital Problems   Diagnosis Date Noted  . Acute metabolic encephalopathy 85/88/5027  . Fall 08/02/2017  . Nasal bone fracture 08/02/2017  . Leukocytosis 08/02/2017  . OSA (obstructive sleep apnea) 04/28/2015  . Dementia 01/19/2015  . Hypothyroid 01/19/2015  . Transaminasemia 03/26/2012    Resolved Hospital Problems  No resolved problems to display.    Discharge Condition: Stable  CODE STATUS: Full Diet recommendation: Heart Healthy   Vitals:   08/06/17 0421 08/06/17 0820  BP: (!) 143/91 132/88  Pulse: 65 68  Resp: 20 18  Temp: 98.7 F (37.1 C) 98 F (36.7 C)  SpO2: 94% 96%    History of present illness:  Jonathon Grimes is a 77 y.o. year old male with medical history significant for dementia with behavioral disturbance, hypothyroidism, OSA not on CPAP, GERD  who presented on 08/01/2017 with worsening confusion and agitation x 1 week and recurrent falls and was found to have acute encephalopathy of unclear etiology.  Infectious, metabolic, neurologic etiologies were ruled out.  Presumed change in mentation was related to behavior disturbance in a patient with known dementia.  Hospital course addressed below in based format:  Hospital Course:  Principal Problem:   Acute metabolic encephalopathy Active Problems:   Transaminasemia   Dementia   Hypothyroid   OSA (obstructive sleep apnea)   Fall   Nasal bone fracture   Leukocytosis   #Acute encephalopathy, resolved Likely acute behavioral disturbance in a patient with known dementia.  Given unrevealing workup detailed below: -Within 24 hours of admission patient was  empirically started on empiric antibiotic (vancomycin, ceftriaxone, ampicillin, acyclovir) therapy for presumed meningitis given patient was confused and did have a T-max of 102.  LP was negative and empiric antibiotics were discontinued which neurology consult was agreed with -Patient returned to baseline within 48 hours of admission -Patient has remained afebrile off antibiotics for the past 24 hours, additionally with no growth of blood cultures, CSF cultures, unrevealing UA, normal RPR, negative HIV. - MRI/CT head negative for any acute intracranial abnormalities - Normal B12, negative UDS, normal acetaminophen level, ammonia/ethanol levels noncontributory  #Sirs Initial concern for possible pneumonia given chest x-ray show hypoventilatory effort and possible opacity.  Repeat chest x-ray showed no opacities and no effusion. -Patient remained afebrile with oxygen requirement and no cough while off antibiotics. -Normal oxygen saturation on room air prior to discharge  #Elevated AST, chronic Ammonia, platelets, INR are normal.  Tylenol level normal, hepatitis panel within normal limits. -Patient without any abdominal pain during hospital stay  #Recurrent falls, worsening behavior at home - CT/MRI head imaging negative for acute intracranial normalities, metabolic workup was also negative as mentioned above -PT recommended SNF  #Hypothyroidism -TSH elevated at 6.7 -Continue home dose Synthroid (suspect patient was not adherent with medication therapy), will need repeat TSH in 4-6 weeks  #Dysphagia -Speech recommended: Dysphagia 3 (mechanical); nectar thick liquid (liquids provided via cup, medication restriction crushed with pure, supervision patient able to self feed,'s 0.9 degrees, oral care twice daily lucencies                Consultants:  Neurology  Procedures:  LP  on 3/8  Antimicrobials:  3/7-3/9: vancomycin  3/7-3/10 ampicillin  3/7-3/9 ceftriaxone  3/11-1999  acyclovir  Cultures:  3/7 blood -NGTD   3/8 CSF culture negative growth   Procedures/Studies:  Dg Chest 2 View  Result Date: 08/04/2017 CLINICAL DATA:  Encounter for cough, SIRS EXAM: CHEST - 2 VIEW COMPARISON:  Chest x-rays dated 08/03/2007, 07/17/2016 and 02/10/2016. FINDINGS: Study is slightly hypoinspiratory. Given the low lung volumes, lungs are clear. Heart size and mediastinal contours are stable. No pleural effusion or pneumothorax. Osseous structures about the chest are unremarkable. IMPRESSION: No active cardiopulmonary disease. No evidence of pneumonia or pulmonary edema. Electronically Signed   By: Jonathon Grimes M.D.   On: 08/04/2017 16:05   Dg Chest 2 View  Result Date: 08/02/2017 CLINICAL DATA:  Altered mental status. Four falls over the past week. EXAM: CHEST - 2 VIEW COMPARISON:  Radiographs and CT 07/17/2016 FINDINGS: Low lung volumes. Bronchovascular crowding and accentuated heart size secondary to hypoaeration. Streaky bibasilar atelectasis. Possible tiny residual right pleural effusion. No confluent airspace disease. No pneumothorax. No acute osseous abnormalities are seen. IMPRESSION: Hypoventilatory chest with bronchovascular crowding and bibasilar atelectasis. Possible right pleural effusion. Electronically Signed   By: Jonathon Grimes M.D.   On: 08/02/2017 02:35   Ct Head Wo Contrast  Result Date: 08/01/2017 CLINICAL DATA:  Recurrent falls. EXAM: CT HEAD WITHOUT CONTRAST TECHNIQUE: Contiguous axial images were obtained from the base of the skull through the vertex without intravenous contrast. COMPARISON:  MRI brain dated November 24, 2016. CT head dated July 27, 2014. FINDINGS: Brain: No evidence of acute infarction, hemorrhage, hydrocephalus, extra-axial collection or mass lesion/mass effect. Stable mild atrophy and chronic microvascular ischemic changes. Unchanged tiny lacunar infarcts in the bilateral basal ganglia. Vascular: Calcified atherosclerosis at the  skullbase. No hyperdense vessel. Skull: Normal. Negative for fracture or focal lesion. Sinuses/Orbits: No acute finding. Mucous retention cyst in the right maxillary sinus. Other: None. IMPRESSION: 1. No acute intracranial abnormality. Stable mild atrophy and chronic microvascular ischemic changes. Electronically Signed   By: Titus Dubin M.D.   On: 08/01/2017 13:09   Mr Brain Wo Contrast  Result Date: 08/01/2017 CLINICAL DATA:  Initial evaluation for acute altered mental status. EXAM: MRI HEAD WITHOUT CONTRAST TECHNIQUE: Multiplanar, multiecho pulse sequences of the brain and surrounding structures were obtained without intravenous contrast. COMPARISON:  Prior CT from earlier the same day. FINDINGS: Brain: Generalized age-related cerebral atrophy. Minimal T2/FLAIR hyperintensity within the periventricular white matter, nonspecific, but felt to be within normal limits for age. No abnormal foci of restricted diffusion to suggest acute or subacute ischemia. Gray-white matter differentiation maintained. No evidence for remote cortical infarction. Possible tiny remote left basal ganglia lacunar infarct noted. No evidence for acute intracranial hemorrhage. No mass lesion, midline shift or mass effect. Mild diffuse ventricular prominence related to global parenchymal volume loss of hydrocephalus. No extra-axial fluid collection. Major dural sinuses grossly patent. Pituitary gland suprasellar region normal. Midline structures intact and normal. Vascular: Major intracranial vascular flow voids are maintained. Skull and upper cervical spine: Craniocervical junction normal. Visualized upper cervical spine within normal limits. Bone marrow signal intensity normal. No scalp soft tissue abnormality. Sinuses/Orbits: Globes and orbital soft tissues within normal limits. Patient status post cataract extraction bilaterally. Mild scattered mucosal thickening within the ethmoidal air cells and maxillary sinuses. Right maxillary  sinus retention cyst partially visualized. No significant mastoid effusion. Partially visualized parotid glands are atrophic in appearance bilaterally. Other: None. IMPRESSION: Negative brain MRI.  No acute intracranial  process identified. Electronically Signed   By: Jeannine Boga M.D.   On: 08/01/2017 23:35   Dg Shoulder Left  Result Date: 08/01/2017 CLINICAL DATA:  Patient has fallen 4 times over the past week. Patient has pain when lifting the left arm since the most recent fall. EXAM: LEFT SHOULDER - 2+ VIEW COMPARISON:  Limited views of the left shoulder from a chest x-ray of July 07, 2016 FINDINGS: The bones of the shoulder are subjectively adequately mineralized. No acute fracture or dislocation is observed. The positioning of the humeral head appears slightly superior with respect to the glenoid which may indicate rotator cuff pathology. IMPRESSION: No acute fracture or dislocation of the left shoulder is observed. I cannot exclude rotator cuff pathology given the slightly superior positioning of the humeral head with respect to the glenoid. Electronically Signed   By: David  Martinique M.D.   On: 08/01/2017 12:41   Ct Maxillofacial Wo Contrast  Result Date: 08/01/2017 CLINICAL DATA:  77 y/o M; multiple falls over last week, nasal fracture suspected. EXAM: CT MAXILLOFACIAL WITHOUT CONTRAST TECHNIQUE: Multidetector CT imaging of the maxillofacial structures was performed. Multiplanar CT image reconstructions were also generated. COMPARISON:  08/01/2017 CT head. FINDINGS: Osseous: Minimally displaced fractures of the left nasal bone and anterior most nasal septum (series 4, image 70 and series 5, image 18). No other facial fracture identified. Orbits: Negative. No traumatic or inflammatory finding. Sinuses: Right maxillary sinus mucous retention cyst and aerosolized secretions in the right sphenoid sinus. Otherwise negative. Soft tissues: Negative. Limited intracranial: No significant or unexpected  finding. IMPRESSION: Minimally displaced fractures of the left nasal bone and anterior most nasal septum. No other fracture identified. Electronically Signed   By: Kristine Garbe M.D.   On: 08/01/2017 17:07     Discharge Exam: BP 132/88 (BP Location: Left Arm)   Pulse 68   Temp 98 F (36.7 C) (Oral)   Resp 18   Ht 5\' 8"  (1.727 m)   Wt 85.8 kg (189 lb 2.5 oz)   SpO2 96%   BMI 28.76 kg/m   General: Comfortably lying in bed Eyes: EOMI, anicteric ENT: Oral Mucosa clear and moist Cardiovascular: regular rate and rhythm, no murmurs, rubs or gallops, no peripheral edema Respiratory: Normal respiratory effort on room air, lungs clear to auscultation bilaterally Abdomen: soft, non-distended, non-tender, normal bowel sounds Skin: No Rash Musculoskeletal:Good ROM, no contractures. Normal muscle tone Neurologic: Grossly no focal neuro deficit.Mental status: Alert, oriented to self, place [facility]), time (month) Psychiatric:Appropriate affect, and mood   Discharge Instructions You were cared for by a hospitalist during your hospital stay. If you have any questions about your discharge medications or the care you received while you were in the hospital after you are discharged, you can call the unit and asked to speak with the hospitalist on call if the hospitalist that took care of you is not available. Once you are discharged, your primary care physician will handle any further medical issues. Please note that NO REFILLS for any discharge medications will be authorized once you are discharged, as it is imperative that you return to your primary care physician (or establish a relationship with a primary care physician if you do not have one) for your aftercare needs so that they can reassess your need for medications and monitor your lab values.   Allergies as of 08/06/2017      Reactions   Celebrex [celecoxib] Other (See Comments)   Unknown      Medication  List    STOP  taking these medications   diazepam 5 MG tablet Commonly known as:  VALIUM   donepezil 10 MG tablet Commonly known as:  ARICEPT     TAKE these medications   aspirin 81 MG chewable tablet Chew 1 tablet (81 mg total) by mouth daily.   calcium-vitamin D 250-100 MG-UNIT tablet Take 1 tablet by mouth daily.   food thickener Powd Commonly known as:  THICK IT Use as need with meals   levothyroxine 100 MCG tablet Commonly known as:  SYNTHROID, LEVOTHROID Take 1 tablet (100 mcg total) by mouth daily before breakfast.   mouth rinse Liqd solution 15 mLs by Mouth Rinse route 2 (two) times daily.   Vitamin D (Ergocalciferol) 50000 units Caps capsule Commonly known as:  DRISDOL Take 1 capsule (50,000 Units total) by mouth every 7 (seven) days. Once done with prescription, take over the counter Vitamin D 2,000u once daily      Allergies  Allergen Reactions  . Celebrex [Celecoxib] Other (See Comments)    Unknown      The results of significant diagnostics from this hospitalization (including imaging, microbiology, ancillary and laboratory) are listed below for reference.    Significant Diagnostic Studies: Dg Chest 2 View  Result Date: 08/04/2017 CLINICAL DATA:  Encounter for cough, SIRS EXAM: CHEST - 2 VIEW COMPARISON:  Chest x-rays dated 08/03/2007, 07/17/2016 and 02/10/2016. FINDINGS: Study is slightly hypoinspiratory. Given the low lung volumes, lungs are clear. Heart size and mediastinal contours are stable. No pleural effusion or pneumothorax. Osseous structures about the chest are unremarkable. IMPRESSION: No active cardiopulmonary disease. No evidence of pneumonia or pulmonary edema. Electronically Signed   By: Jonathon Grimes M.D.   On: 08/04/2017 16:05   Dg Chest 2 View  Result Date: 08/02/2017 CLINICAL DATA:  Altered mental status. Four falls over the past week. EXAM: CHEST - 2 VIEW COMPARISON:  Radiographs and CT 07/17/2016 FINDINGS: Low lung volumes. Bronchovascular crowding  and accentuated heart size secondary to hypoaeration. Streaky bibasilar atelectasis. Possible tiny residual right pleural effusion. No confluent airspace disease. No pneumothorax. No acute osseous abnormalities are seen. IMPRESSION: Hypoventilatory chest with bronchovascular crowding and bibasilar atelectasis. Possible right pleural effusion. Electronically Signed   By: Jonathon Grimes M.D.   On: 08/02/2017 02:35   Ct Head Wo Contrast  Result Date: 08/01/2017 CLINICAL DATA:  Recurrent falls. EXAM: CT HEAD WITHOUT CONTRAST TECHNIQUE: Contiguous axial images were obtained from the base of the skull through the vertex without intravenous contrast. COMPARISON:  MRI brain dated November 24, 2016. CT head dated July 27, 2014. FINDINGS: Brain: No evidence of acute infarction, hemorrhage, hydrocephalus, extra-axial collection or mass lesion/mass effect. Stable mild atrophy and chronic microvascular ischemic changes. Unchanged tiny lacunar infarcts in the bilateral basal ganglia. Vascular: Calcified atherosclerosis at the skullbase. No hyperdense vessel. Skull: Normal. Negative for fracture or focal lesion. Sinuses/Orbits: No acute finding. Mucous retention cyst in the right maxillary sinus. Other: None. IMPRESSION: 1. No acute intracranial abnormality. Stable mild atrophy and chronic microvascular ischemic changes. Electronically Signed   By: Titus Dubin M.D.   On: 08/01/2017 13:09   Mr Brain Wo Contrast  Result Date: 08/01/2017 CLINICAL DATA:  Initial evaluation for acute altered mental status. EXAM: MRI HEAD WITHOUT CONTRAST TECHNIQUE: Multiplanar, multiecho pulse sequences of the brain and surrounding structures were obtained without intravenous contrast. COMPARISON:  Prior CT from earlier the same day. FINDINGS: Brain: Generalized age-related cerebral atrophy. Minimal T2/FLAIR hyperintensity within the periventricular white matter,  nonspecific, but felt to be within normal limits for age. No abnormal foci of  restricted diffusion to suggest acute or subacute ischemia. Gray-white matter differentiation maintained. No evidence for remote cortical infarction. Possible tiny remote left basal ganglia lacunar infarct noted. No evidence for acute intracranial hemorrhage. No mass lesion, midline shift or mass effect. Mild diffuse ventricular prominence related to global parenchymal volume loss of hydrocephalus. No extra-axial fluid collection. Major dural sinuses grossly patent. Pituitary gland suprasellar region normal. Midline structures intact and normal. Vascular: Major intracranial vascular flow voids are maintained. Skull and upper cervical spine: Craniocervical junction normal. Visualized upper cervical spine within normal limits. Bone marrow signal intensity normal. No scalp soft tissue abnormality. Sinuses/Orbits: Globes and orbital soft tissues within normal limits. Patient status post cataract extraction bilaterally. Mild scattered mucosal thickening within the ethmoidal air cells and maxillary sinuses. Right maxillary sinus retention cyst partially visualized. No significant mastoid effusion. Partially visualized parotid glands are atrophic in appearance bilaterally. Other: None. IMPRESSION: Negative brain MRI.  No acute intracranial process identified. Electronically Signed   By: Jeannine Boga M.D.   On: 08/01/2017 23:35   Dg Shoulder Left  Result Date: 08/01/2017 CLINICAL DATA:  Patient has fallen 4 times over the past week. Patient has pain when lifting the left arm since the most recent fall. EXAM: LEFT SHOULDER - 2+ VIEW COMPARISON:  Limited views of the left shoulder from a chest x-ray of July 07, 2016 FINDINGS: The bones of the shoulder are subjectively adequately mineralized. No acute fracture or dislocation is observed. The positioning of the humeral head appears slightly superior with respect to the glenoid which may indicate rotator cuff pathology. IMPRESSION: No acute fracture or dislocation  of the left shoulder is observed. I cannot exclude rotator cuff pathology given the slightly superior positioning of the humeral head with respect to the glenoid. Electronically Signed   By: David  Martinique M.D.   On: 08/01/2017 12:41   Ct Maxillofacial Wo Contrast  Result Date: 08/01/2017 CLINICAL DATA:  77 y/o M; multiple falls over last week, nasal fracture suspected. EXAM: CT MAXILLOFACIAL WITHOUT CONTRAST TECHNIQUE: Multidetector CT imaging of the maxillofacial structures was performed. Multiplanar CT image reconstructions were also generated. COMPARISON:  08/01/2017 CT head. FINDINGS: Osseous: Minimally displaced fractures of the left nasal bone and anterior most nasal septum (series 4, image 70 and series 5, image 18). No other facial fracture identified. Orbits: Negative. No traumatic or inflammatory finding. Sinuses: Right maxillary sinus mucous retention cyst and aerosolized secretions in the right sphenoid sinus. Otherwise negative. Soft tissues: Negative. Limited intracranial: No significant or unexpected finding. IMPRESSION: Minimally displaced fractures of the left nasal bone and anterior most nasal septum. No other fracture identified. Electronically Signed   By: Kristine Garbe M.D.   On: 08/01/2017 17:07    Microbiology: Recent Results (from the past 240 hour(s))  Culture, blood (Routine X 2) w Reflex to ID Panel     Status: None (Preliminary result)   Collection Time: 08/02/17  3:30 AM  Result Value Ref Range Status   Specimen Description BLOOD RIGHT WRIST  Final   Special Requests AEROBIC BOTTLE ONLY Blood Culture adequate volume  Final   Culture   Final    NO GROWTH 3 DAYS Performed at [facility], [address] [place],  [address]    Report Status PENDING  Incomplete  Culture, blood (Routine X 2) w Reflex to ID Panel     Status: None (Preliminary result)  Collection Time: 08/02/17  3:38 AM  Result Value Ref Range Status   Specimen Description BLOOD  RIGHT ANTECUBITAL  Final   Special Requests AEROBIC BOTTLE ONLY Blood Culture adequate volume  Final   Culture   Final    NO GROWTH 3 DAYS Performed at [facility], [address] [place], [place] [address]    Report Status PENDING  Incomplete  CSF culture     Status: None   Collection Time: 08/03/17  1:33 PM  Result Value Ref Range Status   Specimen Description CSF  Final   Special Requests TUBE 4  Final   Gram Stain   Final    WBC PRESENT, PREDOMINANTLY MONONUCLEAR NO ORGANISMS SEEN    Culture   Final    NO GROWTH 3 DAYS Performed at [facility], [place] [address], [place], [place] [address]    Report Status 08/06/2017 FINAL  Final     Labs: Basic Metabolic Panel: Recent Labs  Lab 08/01/17 1125 08/01/17 1142 08/02/17 0330 08/03/17 0406 08/04/17 0424 08/05/17 0542  NA 141 143 139 140 140 140  K 4.4 4.3 3.7 3.4* 3.6 3.8  CL 107 106 107 106 106 108  CO2 24  --  21* 23 21* 23  GLUCOSE 100* 95 92 123* 135* 98  BUN 25* 29* 28* 30* 22* 18  CREATININE 1.23 1.10 1.06 1.12 0.97 0.95  CALCIUM 9.1  --  8.5* 8.1* 8.3* 8.4*   Liver Function Tests: Recent Labs  Lab 08/01/17 1125 08/02/17 0330  AST 127* 120*  ALT 26 24  ALKPHOS 60 55  BILITOT 1.5* 1.3*  PROT 8.4* 6.8  ALBUMIN 4.2 3.7   No results for input(s): LIPASE, AMYLASE in the last 168 hours. Recent Labs  Lab 08/01/17 2108  AMMONIA 25   CBC: Recent Labs  Lab 08/01/17 1125 08/01/17 1142 08/03/17 0406 08/04/17 0424 08/05/17 0542 08/06/17 0714  WBC 12.9*  --  8.1 6.6 6.1 6.7  NEUTROABS 10.7*  --   --   --   --   --   HGB 13.4 14.3 11.7* 11.3* 11.7* 12.3*  HCT 40.1 42.0 35.2* 34.8* 35.9* 37.5*  MCV 99.3  --  99.7 98.9 100.0 99.7  PLT 201  --  191 195 219 258   Cardiac Enzymes: No results for input(s): CKTOTAL, CKMB, CKMBINDEX, TROPONINI in the last 168 hours. BNP: BNP (last 3 results) Recent Labs    08/02/17 1205  BNP 60.0    ProBNP (last 3 results) No results for input(s): PROBNP  in the last 8760 hours.  CBG: Recent Labs  Lab 08/03/17 0621 08/03/17 0820  GLUCAP 113* 119*       Signed:  Desiree Hane, MD [facility] 08/06/2017, 12:36 PM

## 2017-08-06 NOTE — Care Management Note (Signed)
Case Management Note  Patient Details  Name: Jonathon Grimes MRN: [id number] Date of Birth: 1940/07/03  Subjective/Objective:                    Action/Plan: Pt discharging to [facility]. CM signing off.  Expected Discharge Date:  08/06/17               Expected Discharge Plan:  Skilled Nursing Facility  In-House Referral:  Clinical Social Work  Discharge planning Services     Post Acute Care Choice:    Choice offered to:     DME Arranged:    DME Agency:     HH Arranged:    [place] Agency:     Status of Service:  Completed, signed off  If discussed at [employer], dates discussed:    Additional Comments:  Pollie Friar, RN 08/06/2017, 1:24 PM

## 2017-08-06 NOTE — Progress Notes (Signed)
Physical Therapy Treatment Patient Details Name: Jonathon Grimes MRN: [id number] DOB: 10-11-1940 Today's Date: 08/06/2017    History of Present Illness Mr. Bhakta is a 77 y/o male admitted on 08/01/17 with altered mental status and a history of falls. Patient with a PMH significant for dementia, OSA, GERD, HTN, asbestosis.     PT Comments    Patient progressing slowly towards PT goals. Continues to demonstrate poor safety awareness. Pt bumping into objects on left side of environment and needs max directional cues for safety. Discharge plan updated to SNF as pt's g/f leaving and pt will not have any support at home. High fall risk. Will follow.   Follow Up Recommendations  SNF;Supervision for mobility/OOB     Equipment Recommendations  Rolling walker with 5" wheels    Recommendations for Other Services       Precautions / Restrictions Precautions Precautions: Fall Restrictions Weight Bearing Restrictions: No    Mobility  Bed Mobility Overal bed mobility: Needs Assistance Bed Mobility: Supine to Sit     Supine to sit: Supervision     General bed mobility comments: Increased effort to get to EOB but no assist needed.   Transfers Overall transfer level: Needs assistance Equipment used: Rolling walker (2 wheeled) Transfers: Sit to/from Stand Sit to Stand: Min guard         General transfer comment: Min guard for safety. Stood from [employer].   Ambulation/Gait Ambulation/Gait assistance: Min guard Ambulation Distance (Feet): 120 Feet Assistive device: Rolling walker (2 wheeled) Gait Pattern/deviations: Step-through pattern;Decreased stride length;Trunk flexed Gait velocity: decreased   General Gait Details: Slow, unsteady gait with pt running into environment on left side needing directional cues for safety.    Stairs            Wheelchair Mobility    Modified Rankin (Stroke Patients Only)       Balance Overall balance assessment: Needs  assistance Sitting-balance support: Feet supported;No upper extremity supported Sitting balance-Leahy Scale: Good     Standing balance support: During functional activity Standing balance-Leahy Scale: Poor Standing balance comment: Reaching onto bed to grab glasses with LOB requiring external support.                            Cognition Arousal/Alertness: Awake/alert Behavior During Therapy: Impulsive Overall Cognitive Status: Impaired/Different from baseline Area of Impairment: Orientation;Safety/judgement;Awareness                 Orientation Level: Place;Situation;Time       Safety/Judgement: Decreased awareness of safety;Decreased awareness of deficits Awareness: Intellectual   General Comments: Uses humor to mask symptoms and not knowing answers to questions. Knows it is March.      Exercises      General Comments        Pertinent Vitals/Pain Pain Assessment: No/denies pain Faces Pain Scale: No hurt    Home Living                      Prior Function            PT Goals (current goals can now be found in the care plan section) Progress towards PT goals: Progressing toward goals    Frequency    Min 4X/week      PT Plan Current plan remains appropriate    Co-evaluation              AM-PAC PT "6 Clicks" Daily Activity  Outcome Measure  Difficulty turning over in bed (including adjusting bedclothes, sheets and blankets)?: None Difficulty moving from lying on back to sitting on the side of the bed? : None Difficulty sitting down on and standing up from a chair with arms (e.g., wheelchair, bedside commode, etc,.)?: None Help needed moving to and from a bed to chair (including a wheelchair)?: A Little Help needed walking in hospital room?: A Little Help needed climbing 3-5 steps with a railing? : A Little 6 Click Score: 21    End of Session Equipment Utilized During Treatment: Gait belt Activity Tolerance: Patient  tolerated treatment well Patient left: in bed;with call bell/phone within reach;with bed alarm set(sitting EOB.) Nurse Communication: Mobility status PT Visit Diagnosis: Unsteadiness on feet (R26.81);Other abnormalities of gait and mobility (R26.89);Repeated falls (R29.6);Difficulty in walking, not elsewhere classified (R26.2)     Time: 6073-7106 PT Time Calculation (min) (ACUTE ONLY): 17 min  Charges:  $Gait Training: 8-22 mins                    G Codes:       Wray Kearns, PT, DPT [phone]     [place] 08/06/2017, 1:42 PM

## 2017-08-06 NOTE — Clinical Social Work Placement (Signed)
Nurse to call report to [phone], [place]  NOTE  Date:  08/06/2017  Patient Details  Name: Jonathon Grimes MRN: [id number] Date of Birth: Dec 03, 1940  Clinical Social Work is seeking post-discharge placement for this patient at the [place] level of care (*CSW will initial, date and re-position this form in  chart as items are completed):  Yes   Patient/family provided with [place] Work Department's list of facilities offering this level of care within the geographic area requested by the patient (or if unable, by the patient's family).  Yes   Patient/family informed of their freedom to choose among providers that offer the needed level of care, that participate in Medicare, Medicaid or managed care program needed by the patient, have an available bed and are willing to accept the patient.  Yes   Patient/family informed of [place]'s ownership interest in [facility] and [facility], as well as of the fact that they are under no obligation to receive care at these facilities.  PASRR submitted to EDS on       PASRR number received on 08/06/17     Existing PASRR number confirmed on       FL2 transmitted to all facilities in geographic area requested by pt/family on 08/06/17     FL2 transmitted to all facilities within larger geographic area on       Patient informed that his/her managed care company has contracts with or will negotiate with certain facilities, including the following:        Yes   Patient/family informed of bed offers received.  Patient chooses bed at [facility]     Physician recommends and patient chooses bed at      Patient to be transferred to [facility] on 08/06/17.  Patient to be transferred to facility by Family car     Patient family notified on 08/06/17 of transfer.  Name of family member notified:  Son, Eddie     PHYSICIAN       Additional  Comment:    _______________________________________________ Geralynn Ochs, LCSW 08/06/2017, 2:15 PM

## 2017-08-07 DIAGNOSIS — G9341 Metabolic encephalopathy: Secondary | ICD-10-CM | POA: Diagnosis not present

## 2017-08-07 DIAGNOSIS — E039 Hypothyroidism, unspecified: Secondary | ICD-10-CM | POA: Diagnosis not present

## 2017-08-07 DIAGNOSIS — I10 Essential (primary) hypertension: Secondary | ICD-10-CM | POA: Diagnosis not present

## 2017-08-07 DIAGNOSIS — F0390 Unspecified dementia without behavioral disturbance: Secondary | ICD-10-CM | POA: Diagnosis not present

## 2017-08-07 LAB — CULTURE, BLOOD (ROUTINE X 2)
Culture: NO GROWTH
Culture: NO GROWTH
SPECIAL REQUESTS: ADEQUATE
Special Requests: ADEQUATE

## 2017-08-08 ENCOUNTER — Other Ambulatory Visit: Payer: Self-pay

## 2017-08-08 ENCOUNTER — Encounter: Payer: Self-pay | Admitting: Neurology

## 2017-08-08 ENCOUNTER — Ambulatory Visit (INDEPENDENT_AMBULATORY_CARE_PROVIDER_SITE_OTHER): Payer: Medicare Other | Admitting: Neurology

## 2017-08-08 VITALS — BP 121/73 | HR 63 | Resp 18 | Ht 68.0 in | Wt 189.0 lb

## 2017-08-08 DIAGNOSIS — R39198 Other difficulties with micturition: Secondary | ICD-10-CM | POA: Diagnosis not present

## 2017-08-08 DIAGNOSIS — G3281 Cerebellar ataxia in diseases classified elsewhere: Secondary | ICD-10-CM | POA: Diagnosis not present

## 2017-08-08 DIAGNOSIS — R32 Unspecified urinary incontinence: Secondary | ICD-10-CM

## 2017-08-08 DIAGNOSIS — G4752 REM sleep behavior disorder: Secondary | ICD-10-CM | POA: Diagnosis not present

## 2017-08-08 DIAGNOSIS — I10 Essential (primary) hypertension: Secondary | ICD-10-CM | POA: Diagnosis not present

## 2017-08-08 DIAGNOSIS — R4689 Other symptoms and signs involving appearance and behavior: Secondary | ICD-10-CM | POA: Diagnosis not present

## 2017-08-08 DIAGNOSIS — G4733 Obstructive sleep apnea (adult) (pediatric): Secondary | ICD-10-CM | POA: Diagnosis not present

## 2017-08-08 DIAGNOSIS — F0391 Unspecified dementia with behavioral disturbance: Secondary | ICD-10-CM

## 2017-08-08 DIAGNOSIS — W19XXXA Unspecified fall, initial encounter: Secondary | ICD-10-CM

## 2017-08-08 DIAGNOSIS — E039 Hypothyroidism, unspecified: Secondary | ICD-10-CM | POA: Diagnosis not present

## 2017-08-08 DIAGNOSIS — G9341 Metabolic encephalopathy: Secondary | ICD-10-CM | POA: Diagnosis not present

## 2017-08-08 DIAGNOSIS — F0390 Unspecified dementia without behavioral disturbance: Secondary | ICD-10-CM | POA: Diagnosis not present
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
## 2017-08-09 ENCOUNTER — Telehealth: Payer: Self-pay | Admitting: Neurology

## 2017-08-09 NOTE — Telephone Encounter (Signed)
Patient has medicare doesn't require auth. Order sent to GI they will reach out to the patient to schedule.

## 2017-08-13 ENCOUNTER — Other Ambulatory Visit: Payer: Self-pay | Admitting: Licensed Clinical Social Worker
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
## 2017-08-15 ENCOUNTER — Encounter: Payer: Self-pay | Admitting: Licensed Clinical Social Worker

## 2017-08-15 ENCOUNTER — Other Ambulatory Visit: Payer: Self-pay | Admitting: Licensed Clinical Social Worker

## 2017-08-15 DIAGNOSIS — F0390 Unspecified dementia without behavioral disturbance: Secondary | ICD-10-CM | POA: Diagnosis not present

## 2017-08-15 DIAGNOSIS — G9341 Metabolic encephalopathy: Secondary | ICD-10-CM | POA: Diagnosis not present

## 2017-08-15 NOTE — Patient Outreach (Signed)
[facility]) Care Management  [facility] Social Work  08/15/2017  Jonathon Grimes [birth year] [id number]  Subjective:    Objective:   Encounter Medications:  Outpatient Encounter Medications as of 08/15/2017  Medication Sig  . aspirin 81 MG chewable tablet Chew 1 tablet (81 mg total) by mouth daily.  . calcium-vitamin D 250-100 MG-UNIT tablet Take 1 tablet by mouth daily.  . food thickener (THICK IT) POWD Use as need with meals  . levothyroxine (SYNTHROID, LEVOTHROID) 100 MCG tablet Take 1 tablet (100 mcg total) by mouth daily before breakfast.  . mouth rinse LIQD solution 15 mLs by Mouth Rinse route 2 (two) times daily.  . Vitamin D, Ergocalciferol, (DRISDOL) 50000 units CAPS capsule Take 1 capsule (50,000 Units total) by mouth every 7 (seven) days. Once done with prescription, take over the counter Vitamin D 2,000u once daily   No facility-administered encounter medications on file as of 08/15/2017.     Functional Status:  In your present state of health, do you have any difficulty performing the following activities: 08/15/2017 08/02/2017  Hearing? N N  Vision? Y Y  Difficulty concentrating or making decisions? Tempie Donning  Walking or climbing stairs? Y Y  Dressing or bathing? Y Y  Doing errands, shopping? Y N  Preparing Food and eating ? Y -  Using the Toilet? N -  In the past six months, have you accidently leaked urine? N -  Do you have problems with loss of bowel control? N -  Managing your Medications? Y -  Managing your Finances? Y -  Housekeeping or managing your Housekeeping? Y -  Some recent data might be hidden    Fall/Depression Screening:  PHQ 2/9 Scores 08/15/2017  PHQ - 2 Score 2  PHQ- 9 Score 8    Assessment:   CSW completed chart review on client on 08/15/17.  CSW has talked with Gerrit Friends , social worker at [facility], regarding client needs and status. CSW has talked with Ruel Favors, son of client, about client needs. Client has history of  some aggressive behavior.  Client has girlfriend, Bertram Millard, that he had been residing with in the community.  Ramond Marrow is listed on Epic record for patient contact.  CSW traveled to [facility] in [place], [place] on 08/15/17  to visit client. CSW met with client on 08/15/17 at [facility] facility in room of client. Client has been receiving nursing care at facility . Client has been receiving physical therapy support at nursing facility. CSW introduced self and talked with Knolan about [facility] program support.  Mardy and CSW completed needed [facility] client assessments. CSW talked with Garnet Koyanagi about client care plan. CSW encouraged that Sem participate in scheduled client physical therapy sessions for client in next 30 days at nursing center.  Client has support from his son, Amarri Satterly. Client has support from girlfriend, [employer]. CSW gave client [facility] CSW card and encouraged client to call CSW at [phone] as needed to discuss social work needs of client. Client said he is eating adequately, sleeping adequately. Client did not speak of any pain issues.  Client likes to walk. He walks without any assistive device.  CSW thanked client for allowing CSW to visit him at facility on 08/15/17. Client was appreciative of visit from [place] on 08/15/17    Plan:   Client to participate in scheduled client physical therapy sessions for client in next 30 days at nursing center.  CSW to call client or son, Ludwig Clarks  Bialecki in 3 weeks to assess client needs.  Norva Riffle.Ifeanyichukwu Wickham MSW, LCSW Licensed Clinical Social Worker [facility] Care Management [phone]

## 2017-08-18 ENCOUNTER — Other Ambulatory Visit: Payer: Medicare Other

## 2017-08-19 ENCOUNTER — Ambulatory Visit
Admission: RE | Admit: 2017-08-19 | Discharge: 2017-08-19 | Disposition: A | Discharge status: Home / Self Care | Payer: Medicare Other | Source: Ambulatory Visit | Attending: Neurology | Admitting: Neurology

## 2017-08-19 DIAGNOSIS — G3281 Cerebellar ataxia in diseases classified elsewhere: Secondary | ICD-10-CM

## 2017-08-19 DIAGNOSIS — M47812 Spondylosis without myelopathy or radiculopathy, cervical region: Secondary | ICD-10-CM | POA: Diagnosis not present

## 2017-08-19 DIAGNOSIS — R32 Unspecified urinary incontinence: Secondary | ICD-10-CM

## 2017-08-20 ENCOUNTER — Telehealth: Payer: Self-pay | Admitting: Neurology

## 2017-08-20 ENCOUNTER — Telehealth: Payer: Self-pay | Admitting: *Deleted

## 2017-08-20 NOTE — Telephone Encounter (Signed)
-----   Message from Britt Bottom, MD sent at 08/20/2017  1:12 PM EDT ----- Regarding: mri resukts Please let him know that the MRI of the cervical spine did not show anything wrong with the spinal cord or any disc herniations

## 2017-08-20 NOTE — Telephone Encounter (Signed)
Patient's fiance' calling to discuss patient's MRI. She said [facility] in [place] said she needs to call our office. Her telephone # is not working but she can be reached at [phone].

## 2017-08-20 NOTE — Telephone Encounter (Signed)
Spoke with Ruby and per RAS, reviewed below lab results.  She verbalized understanding of same/fim

## 2017-08-20 NOTE — Telephone Encounter (Signed)
Spoke with Ruby and per RAS, explained that MRI c-spine did not show anything wrong with the spinal cord or any disc herniations.  She verbalized understanding of same/fim

## 2017-08-23 DIAGNOSIS — G9341 Metabolic encephalopathy: Secondary | ICD-10-CM | POA: Diagnosis not present

## 2017-08-23 DIAGNOSIS — F0390 Unspecified dementia without behavioral disturbance: Secondary | ICD-10-CM | POA: Diagnosis not present

## 2017-08-27 ENCOUNTER — Telehealth: Payer: Self-pay | Admitting: Neurology

## 2017-08-27 NOTE — Telephone Encounter (Signed)
Spoke with Ruby and per RAS, advised there is no evidence that stem cell infusions will be helpful for pt.  She verbalized understanding of same/fim

## 2017-08-27 NOTE — Telephone Encounter (Signed)
Ruby called stating that a few of her family and friends have tried "stem cell infusion" and has worked Fish farm manager for them. Pt and Ruby have read up on this and would like to know if the pt is able to move forward with stem cell infusions. Please call to advise

## 2017-08-29 DIAGNOSIS — I10 Essential (primary) hypertension: Secondary | ICD-10-CM | POA: Diagnosis not present

## 2017-08-29 DIAGNOSIS — G9341 Metabolic encephalopathy: Secondary | ICD-10-CM | POA: Diagnosis not present

## 2017-08-29 DIAGNOSIS — E039 Hypothyroidism, unspecified: Secondary | ICD-10-CM | POA: Diagnosis not present

## 2017-08-29 DIAGNOSIS — F0391 Unspecified dementia with behavioral disturbance: Secondary | ICD-10-CM | POA: Diagnosis not present

## 2017-09-03 ENCOUNTER — Ambulatory Visit (INDEPENDENT_AMBULATORY_CARE_PROVIDER_SITE_OTHER): Payer: Medicare Other | Admitting: Neurology

## 2017-09-03 DIAGNOSIS — R410 Disorientation, unspecified: Secondary | ICD-10-CM | POA: Diagnosis not present

## 2017-09-03 DIAGNOSIS — R4689 Other symptoms and signs involving appearance and behavior: Secondary | ICD-10-CM

## 2017-09-03 DIAGNOSIS — F0391 Unspecified dementia with behavioral disturbance: Secondary | ICD-10-CM

## 2017-09-06 ENCOUNTER — Other Ambulatory Visit: Payer: Medicare Other

## 2017-09-06 DIAGNOSIS — R451 Restlessness and agitation: Secondary | ICD-10-CM | POA: Diagnosis not present

## 2017-09-06 DIAGNOSIS — F0391 Unspecified dementia with behavioral disturbance: Secondary | ICD-10-CM | POA: Diagnosis not present

## 2017-09-06 DIAGNOSIS — F5102 Adjustment insomnia: Secondary | ICD-10-CM | POA: Diagnosis not present

## 2017-09-06 DIAGNOSIS — K219 Gastro-esophageal reflux disease without esophagitis: Secondary | ICD-10-CM | POA: Diagnosis not present

## 2017-09-06 DIAGNOSIS — I10 Essential (primary) hypertension: Secondary | ICD-10-CM | POA: Diagnosis not present

## 2017-09-06 DIAGNOSIS — F4322 Adjustment disorder with anxiety: Secondary | ICD-10-CM | POA: Diagnosis not present

## 2017-09-06 DIAGNOSIS — E039 Hypothyroidism, unspecified: Secondary | ICD-10-CM | POA: Diagnosis not present

## 2017-09-06 NOTE — Progress Notes (Signed)
   [facility]  EEG (ELECTROENCEPHALOGRAM) REPORT   STUDY DATE: 09/03/2017 PATIENT NAME: Jonathon Grimes DOB: 1941-05-02 MRN: [id number]  ORDERING CLINICIAN: Ala Capri A. Felecia Shelling, MD. PhD  TECHNOLOGIST: Oneita Jolly TECHNIQUE: Electroencephalogram was recorded utilizing standard 10-20 system of lead placement and reformatted into average and bipolar montages.  RECORDING TIME: 20 minutes  CLINICAL INFORMATION: 77 year old man with progressive dementia and behavioral disturbance  FINDINGS: A digital EEG was performed while the patient was awake and drowsy. While awake and most alert there was a 7hz  posterior dominant rhythm. Voltages and frequencies were symmetric.  There were no focal, lateralizing, epileptiform activity or seizures seen.  Photic stimulation had a normal driving response.   EKG channel shows some isolated PVCs.  IMPRESSION: This EEG while awake shows mild generalized slowing.  This is nonspecific but is likely related to dementia.  There was no localizing activity or epileptiform activity.   INTERPRETING PHYSICIAN:   Aliyyah Riese A. Felecia Shelling, MD, PhD, [facility] Certified in Neurology, Clinical Neurophysiology, Sleep Medicine, Pain Medicine and Neuroimaging  [facility] [address] [place], [place] [address] [phone]

## 2017-09-07 ENCOUNTER — Telehealth: Payer: Self-pay | Admitting: *Deleted

## 2017-09-07 NOTE — Telephone Encounter (Signed)
-----   Message from Britt Bottom, MD sent at 09/06/2017  6:39 PM EDT ----- Please let the family know that the EEG showed some generalized slowing which is often seen with dementia but no epileptiform/seizure activity

## 2017-09-07 NOTE — Telephone Encounter (Signed)
Per son's request, EEG results faxed to [facility] in [place] (pt. resides in a memory care unit there). Fax# [phone]

## 2017-09-07 NOTE — Telephone Encounter (Signed)
Spoke with son Ludwig Clarks and reviewed below EEG results. He verbalized understanding of same/fim

## 2017-09-12 ENCOUNTER — Ambulatory Visit: Payer: Medicare Other | Admitting: Neurology

## 2017-09-14 DIAGNOSIS — R451 Restlessness and agitation: Secondary | ICD-10-CM | POA: Diagnosis not present

## 2017-09-14 DIAGNOSIS — F4322 Adjustment disorder with anxiety: Secondary | ICD-10-CM | POA: Diagnosis not present

## 2017-10-04 DIAGNOSIS — F4322 Adjustment disorder with anxiety: Secondary | ICD-10-CM | POA: Diagnosis not present

## 2017-10-04 DIAGNOSIS — F0391 Unspecified dementia with behavioral disturbance: Secondary | ICD-10-CM | POA: Diagnosis not present

## 2017-10-04 DIAGNOSIS — E039 Hypothyroidism, unspecified: Secondary | ICD-10-CM | POA: Diagnosis not present

## 2017-10-04 DIAGNOSIS — I10 Essential (primary) hypertension: Secondary | ICD-10-CM | POA: Diagnosis not present

## 2017-10-04 DIAGNOSIS — H1032 Unspecified acute conjunctivitis, left eye: Secondary | ICD-10-CM | POA: Diagnosis not present

## 2017-10-04 DIAGNOSIS — R451 Restlessness and agitation: Secondary | ICD-10-CM | POA: Diagnosis not present

## 2017-10-04 DIAGNOSIS — F5102 Adjustment insomnia: Secondary | ICD-10-CM | POA: Diagnosis not present

## 2017-10-08 DIAGNOSIS — F0391 Unspecified dementia with behavioral disturbance: Secondary | ICD-10-CM | POA: Diagnosis not present

## 2017-10-08 DIAGNOSIS — R262 Difficulty in walking, not elsewhere classified: Secondary | ICD-10-CM | POA: Diagnosis not present

## 2017-10-08 DIAGNOSIS — M6281 Muscle weakness (generalized): Secondary | ICD-10-CM | POA: Diagnosis not present

## 2017-10-08 DIAGNOSIS — R41841 Cognitive communication deficit: Secondary | ICD-10-CM | POA: Diagnosis not present

## 2017-10-18 DIAGNOSIS — E119 Type 2 diabetes mellitus without complications: Secondary | ICD-10-CM | POA: Diagnosis not present

## 2017-10-18 DIAGNOSIS — Z79899 Other long term (current) drug therapy: Secondary | ICD-10-CM | POA: Diagnosis not present

## 2017-10-18 DIAGNOSIS — E7849 Other hyperlipidemia: Secondary | ICD-10-CM | POA: Diagnosis not present

## 2017-10-18 DIAGNOSIS — D518 Other vitamin B12 deficiency anemias: Secondary | ICD-10-CM | POA: Diagnosis not present

## 2017-10-18 DIAGNOSIS — E038 Other specified hypothyroidism: Secondary | ICD-10-CM | POA: Diagnosis not present

## 2017-11-01 DIAGNOSIS — I10 Essential (primary) hypertension: Secondary | ICD-10-CM | POA: Diagnosis not present

## 2017-11-01 DIAGNOSIS — K219 Gastro-esophageal reflux disease without esophagitis: Secondary | ICD-10-CM | POA: Diagnosis not present

## 2017-11-01 DIAGNOSIS — F0391 Unspecified dementia with behavioral disturbance: Secondary | ICD-10-CM | POA: Diagnosis not present

## 2017-11-01 DIAGNOSIS — E039 Hypothyroidism, unspecified: Secondary | ICD-10-CM | POA: Diagnosis not present

## 2017-11-02 DIAGNOSIS — K5792 Diverticulitis of intestine, part unspecified, without perforation or abscess without bleeding: Secondary | ICD-10-CM | POA: Diagnosis not present

## 2017-11-02 DIAGNOSIS — F0390 Unspecified dementia without behavioral disturbance: Secondary | ICD-10-CM | POA: Diagnosis not present

## 2017-11-02 DIAGNOSIS — R531 Weakness: Secondary | ICD-10-CM | POA: Diagnosis not present

## 2017-11-02 DIAGNOSIS — D1809 Hemangioma of other sites: Secondary | ICD-10-CM | POA: Diagnosis not present

## 2017-11-02 DIAGNOSIS — R109 Unspecified abdominal pain: Secondary | ICD-10-CM | POA: Diagnosis not present

## 2017-11-02 DIAGNOSIS — R069 Unspecified abnormalities of breathing: Secondary | ICD-10-CM | POA: Diagnosis not present

## 2017-11-02 DIAGNOSIS — R4182 Altered mental status, unspecified: Secondary | ICD-10-CM | POA: Diagnosis not present

## 2017-11-02 DIAGNOSIS — R0602 Shortness of breath: Secondary | ICD-10-CM | POA: Diagnosis not present

## 2017-11-02 DIAGNOSIS — R001 Bradycardia, unspecified: Secondary | ICD-10-CM | POA: Diagnosis not present

## 2017-11-02 DIAGNOSIS — Z87891 Personal history of nicotine dependence: Secondary | ICD-10-CM | POA: Diagnosis not present

## 2017-11-02 DIAGNOSIS — Z7982 Long term (current) use of aspirin: Secondary | ICD-10-CM | POA: Diagnosis not present

## 2017-11-06 DIAGNOSIS — R1312 Dysphagia, oropharyngeal phase: Secondary | ICD-10-CM | POA: Diagnosis not present

## 2017-11-06 DIAGNOSIS — F0391 Unspecified dementia with behavioral disturbance: Secondary | ICD-10-CM | POA: Diagnosis not present

## 2017-11-06 DIAGNOSIS — R2681 Unsteadiness on feet: Secondary | ICD-10-CM | POA: Diagnosis not present

## 2017-11-06 DIAGNOSIS — I739 Peripheral vascular disease, unspecified: Secondary | ICD-10-CM | POA: Diagnosis not present

## 2017-11-06 DIAGNOSIS — K5790 Diverticulosis of intestine, part unspecified, without perforation or abscess without bleeding: Secondary | ICD-10-CM | POA: Diagnosis not present

## 2017-11-06 DIAGNOSIS — F259 Schizoaffective disorder, unspecified: Secondary | ICD-10-CM | POA: Diagnosis not present

## 2017-11-09 DIAGNOSIS — F0391 Unspecified dementia with behavioral disturbance: Secondary | ICD-10-CM | POA: Diagnosis not present

## 2017-11-09 DIAGNOSIS — F259 Schizoaffective disorder, unspecified: Secondary | ICD-10-CM | POA: Diagnosis not present

## 2017-11-09 DIAGNOSIS — R2681 Unsteadiness on feet: Secondary | ICD-10-CM | POA: Diagnosis not present

## 2017-11-09 DIAGNOSIS — I739 Peripheral vascular disease, unspecified: Secondary | ICD-10-CM | POA: Diagnosis not present

## 2017-11-09 DIAGNOSIS — K5790 Diverticulosis of intestine, part unspecified, without perforation or abscess without bleeding: Secondary | ICD-10-CM | POA: Diagnosis not present

## 2017-11-09 DIAGNOSIS — R1312 Dysphagia, oropharyngeal phase: Secondary | ICD-10-CM | POA: Diagnosis not present

## 2017-11-12 DIAGNOSIS — R1312 Dysphagia, oropharyngeal phase: Secondary | ICD-10-CM | POA: Diagnosis not present

## 2017-11-12 DIAGNOSIS — R2681 Unsteadiness on feet: Secondary | ICD-10-CM | POA: Diagnosis not present

## 2017-11-12 DIAGNOSIS — I739 Peripheral vascular disease, unspecified: Secondary | ICD-10-CM | POA: Diagnosis not present

## 2017-11-12 DIAGNOSIS — K5790 Diverticulosis of intestine, part unspecified, without perforation or abscess without bleeding: Secondary | ICD-10-CM | POA: Diagnosis not present

## 2017-11-12 DIAGNOSIS — F0391 Unspecified dementia with behavioral disturbance: Secondary | ICD-10-CM | POA: Diagnosis not present

## 2017-11-12 DIAGNOSIS — F259 Schizoaffective disorder, unspecified: Secondary | ICD-10-CM | POA: Diagnosis not present

## 2017-11-14 DIAGNOSIS — I739 Peripheral vascular disease, unspecified: Secondary | ICD-10-CM | POA: Diagnosis not present

## 2017-11-14 DIAGNOSIS — F259 Schizoaffective disorder, unspecified: Secondary | ICD-10-CM | POA: Diagnosis not present

## 2017-11-14 DIAGNOSIS — R1312 Dysphagia, oropharyngeal phase: Secondary | ICD-10-CM | POA: Diagnosis not present

## 2017-11-14 DIAGNOSIS — F0391 Unspecified dementia with behavioral disturbance: Secondary | ICD-10-CM | POA: Diagnosis not present

## 2017-11-14 DIAGNOSIS — K5790 Diverticulosis of intestine, part unspecified, without perforation or abscess without bleeding: Secondary | ICD-10-CM | POA: Diagnosis not present

## 2017-11-14 DIAGNOSIS — R2681 Unsteadiness on feet: Secondary | ICD-10-CM | POA: Diagnosis not present

## 2017-11-16 DIAGNOSIS — R2681 Unsteadiness on feet: Secondary | ICD-10-CM | POA: Diagnosis not present

## 2017-11-16 DIAGNOSIS — F5102 Adjustment insomnia: Secondary | ICD-10-CM | POA: Diagnosis not present

## 2017-11-16 DIAGNOSIS — F4322 Adjustment disorder with anxiety: Secondary | ICD-10-CM | POA: Diagnosis not present

## 2017-11-16 DIAGNOSIS — K5790 Diverticulosis of intestine, part unspecified, without perforation or abscess without bleeding: Secondary | ICD-10-CM | POA: Diagnosis not present

## 2017-11-16 DIAGNOSIS — F0391 Unspecified dementia with behavioral disturbance: Secondary | ICD-10-CM | POA: Diagnosis not present

## 2017-11-16 DIAGNOSIS — I739 Peripheral vascular disease, unspecified: Secondary | ICD-10-CM | POA: Diagnosis not present

## 2017-11-16 DIAGNOSIS — R451 Restlessness and agitation: Secondary | ICD-10-CM | POA: Diagnosis not present

## 2017-11-19 DIAGNOSIS — R1312 Dysphagia, oropharyngeal phase: Secondary | ICD-10-CM | POA: Diagnosis not present

## 2017-11-19 DIAGNOSIS — I739 Peripheral vascular disease, unspecified: Secondary | ICD-10-CM | POA: Diagnosis not present

## 2017-11-19 DIAGNOSIS — F0391 Unspecified dementia with behavioral disturbance: Secondary | ICD-10-CM | POA: Diagnosis not present

## 2017-11-19 DIAGNOSIS — F259 Schizoaffective disorder, unspecified: Secondary | ICD-10-CM | POA: Diagnosis not present

## 2017-11-19 DIAGNOSIS — R2681 Unsteadiness on feet: Secondary | ICD-10-CM | POA: Diagnosis not present

## 2017-11-19 DIAGNOSIS — K5790 Diverticulosis of intestine, part unspecified, without perforation or abscess without bleeding: Secondary | ICD-10-CM | POA: Diagnosis not present

## 2017-11-27 DIAGNOSIS — K5790 Diverticulosis of intestine, part unspecified, without perforation or abscess without bleeding: Secondary | ICD-10-CM | POA: Diagnosis not present

## 2017-11-27 DIAGNOSIS — R1312 Dysphagia, oropharyngeal phase: Secondary | ICD-10-CM | POA: Diagnosis not present

## 2017-11-27 DIAGNOSIS — R2681 Unsteadiness on feet: Secondary | ICD-10-CM | POA: Diagnosis not present

## 2017-11-27 DIAGNOSIS — I739 Peripheral vascular disease, unspecified: Secondary | ICD-10-CM | POA: Diagnosis not present

## 2017-11-27 DIAGNOSIS — F259 Schizoaffective disorder, unspecified: Secondary | ICD-10-CM | POA: Diagnosis not present

## 2017-11-27 DIAGNOSIS — F0391 Unspecified dementia with behavioral disturbance: Secondary | ICD-10-CM | POA: Diagnosis not present

## 2017-11-29 DIAGNOSIS — F0391 Unspecified dementia with behavioral disturbance: Secondary | ICD-10-CM | POA: Diagnosis not present

## 2017-11-29 DIAGNOSIS — M150 Primary generalized (osteo)arthritis: Secondary | ICD-10-CM | POA: Diagnosis not present

## 2017-11-29 DIAGNOSIS — E039 Hypothyroidism, unspecified: Secondary | ICD-10-CM | POA: Diagnosis not present

## 2017-12-06 DIAGNOSIS — F0391 Unspecified dementia with behavioral disturbance: Secondary | ICD-10-CM | POA: Diagnosis not present

## 2017-12-06 DIAGNOSIS — F5102 Adjustment insomnia: Secondary | ICD-10-CM | POA: Diagnosis not present

## 2017-12-06 DIAGNOSIS — F4322 Adjustment disorder with anxiety: Secondary | ICD-10-CM | POA: Diagnosis not present

## 2017-12-12 DIAGNOSIS — Z79899 Other long term (current) drug therapy: Secondary | ICD-10-CM | POA: Diagnosis not present

## 2017-12-12 DIAGNOSIS — E038 Other specified hypothyroidism: Secondary | ICD-10-CM | POA: Diagnosis not present

## 2017-12-12 DIAGNOSIS — D518 Other vitamin B12 deficiency anemias: Secondary | ICD-10-CM | POA: Diagnosis not present

## 2017-12-12 DIAGNOSIS — E7849 Other hyperlipidemia: Secondary | ICD-10-CM | POA: Diagnosis not present

## 2017-12-12 DIAGNOSIS — E119 Type 2 diabetes mellitus without complications: Secondary | ICD-10-CM | POA: Diagnosis not present

## 2017-12-12 DIAGNOSIS — E559 Vitamin D deficiency, unspecified: Secondary | ICD-10-CM | POA: Diagnosis not present

## 2017-12-27 DIAGNOSIS — F0391 Unspecified dementia with behavioral disturbance: Secondary | ICD-10-CM | POA: Diagnosis not present

## 2017-12-27 DIAGNOSIS — M150 Primary generalized (osteo)arthritis: Secondary | ICD-10-CM | POA: Diagnosis not present

## 2017-12-27 DIAGNOSIS — E039 Hypothyroidism, unspecified: Secondary | ICD-10-CM | POA: Diagnosis not present

## 2018-01-02 ENCOUNTER — Emergency Department (HOSPITAL_COMMUNITY)
Admission: EM | Admit: 2018-01-02 | Discharge: 2018-01-02 | Disposition: A | Discharge status: Home / Self Care | Payer: Medicare Other | Attending: Emergency Medicine | Admitting: Emergency Medicine

## 2018-01-02 ENCOUNTER — Encounter (HOSPITAL_COMMUNITY): Payer: Self-pay | Admitting: Emergency Medicine

## 2018-01-02 ENCOUNTER — Emergency Department (HOSPITAL_COMMUNITY): Payer: Medicare Other

## 2018-01-02 ENCOUNTER — Other Ambulatory Visit: Payer: Self-pay

## 2018-01-02 DIAGNOSIS — Z79899 Other long term (current) drug therapy: Secondary | ICD-10-CM | POA: Diagnosis not present

## 2018-01-02 DIAGNOSIS — Z7982 Long term (current) use of aspirin: Secondary | ICD-10-CM | POA: Insufficient documentation

## 2018-01-02 DIAGNOSIS — Y92129 Unspecified place in nursing home as the place of occurrence of the external cause: Secondary | ICD-10-CM | POA: Diagnosis not present

## 2018-01-02 DIAGNOSIS — M25512 Pain in left shoulder: Secondary | ICD-10-CM | POA: Diagnosis not present

## 2018-01-02 DIAGNOSIS — S098XXA Other specified injuries of head, initial encounter: Secondary | ICD-10-CM | POA: Diagnosis present

## 2018-01-02 DIAGNOSIS — S0081XA Abrasion of other part of head, initial encounter: Secondary | ICD-10-CM | POA: Diagnosis not present

## 2018-01-02 DIAGNOSIS — M25519 Pain in unspecified shoulder: Secondary | ICD-10-CM | POA: Diagnosis not present

## 2018-01-02 DIAGNOSIS — F0390 Unspecified dementia without behavioral disturbance: Secondary | ICD-10-CM | POA: Diagnosis not present

## 2018-01-02 DIAGNOSIS — R0902 Hypoxemia: Secondary | ICD-10-CM | POA: Diagnosis not present

## 2018-01-02 DIAGNOSIS — W1839XA Other fall on same level, initial encounter: Secondary | ICD-10-CM | POA: Insufficient documentation

## 2018-01-02 DIAGNOSIS — W19XXXA Unspecified fall, initial encounter: Secondary | ICD-10-CM | POA: Diagnosis not present

## 2018-01-02 DIAGNOSIS — I10 Essential (primary) hypertension: Secondary | ICD-10-CM | POA: Diagnosis not present

## 2018-01-02 DIAGNOSIS — Z87891 Personal history of nicotine dependence: Secondary | ICD-10-CM | POA: Insufficient documentation

## 2018-01-02 DIAGNOSIS — Y999 Unspecified external cause status: Secondary | ICD-10-CM | POA: Diagnosis not present

## 2018-01-02 DIAGNOSIS — S0990XA Unspecified injury of head, initial encounter: Secondary | ICD-10-CM | POA: Diagnosis not present

## 2018-01-02 DIAGNOSIS — Y939 Activity, unspecified: Secondary | ICD-10-CM | POA: Diagnosis not present

## 2018-01-02 DIAGNOSIS — E039 Hypothyroidism, unspecified: Secondary | ICD-10-CM | POA: Insufficient documentation

## 2018-01-02 DIAGNOSIS — M79603 Pain in arm, unspecified: Secondary | ICD-10-CM | POA: Diagnosis not present

## 2018-01-02 DIAGNOSIS — S0083XA Contusion of other part of head, initial encounter: Secondary | ICD-10-CM | POA: Diagnosis not present

## 2018-01-02 HISTORY — DX: Unspecified dementia, unspecified severity, without behavioral disturbance, psychotic disturbance, mood disturbance, and anxiety: F03.90

## 2018-01-02 HISTORY — DX: Schizoaffective disorder, unspecified: F25.9

## 2018-01-02 NOTE — ED Triage Notes (Signed)
Pt from [place]. Unwitnessed fall. Hx of demential. Ems found pt on floor. Pt has some bruising noted to right head/forhead. cbg 93. Arrived a/o to most. Denies pain.

## 2018-01-02 NOTE — ED Notes (Signed)
Secretary calling [facility] or possibly EMS to take pt back

## 2018-01-02 NOTE — ED Notes (Signed)
Called [facility] for transport back to facility.  They will be sending a transporter.  Nurse informed.

## 2018-01-02 NOTE — ED Notes (Signed)
Ems wrapped towel around neck for stabilization. This continued. Pt sleeping but easily awakes

## 2018-01-02 NOTE — ED Provider Notes (Signed)
[facility] EMERGENCY DEPARTMENT Provider Note   CSN: [id number] Arrival date & time: 01/02/18  1049     History   Chief Complaint Chief Complaint  Patient presents with  . Fall    HPI Jonathon Grimes is a 77 y.o. male. Level 5 caveat due to dementia. HPI Patient presents after a fall.  Came from nursing home.  Found on floor.  Patient states his legs just gave out.  Does have history of dementia however.  Without complaints now.  Does have abrasion on forehead but unknown of the timing of this.  Level 5 caveat due to dementia. Past Medical History:  Diagnosis Date  . Abdominal pain, generalized   . Asbestosis(501)    diagnosed in 2001  . Dementia   . Early satiety   . GERD (gastroesophageal reflux disease)   . Heartburn   . History of colonoscopy    2008 Dr. Anthony Sar and was normal.  . Hypertension   . Hypothyroidism   . Nonspecific elevation of levels of transaminase or lactic acid dehydrogenase (LDH)   . Open wound of finger(s) , without mention of complication   . Schizoaffective disorder [facility])     Patient Active Problem List   Diagnosis Date Noted  . Urinary dysfunction 08/08/2017  . Outbursts of explosive behavior 08/08/2017  . Urinary incontinence 08/08/2017  . Fall 08/02/2017  . Acute metabolic encephalopathy 85/63/1497  . Nasal bone fracture 08/02/2017  . Leukocytosis 08/02/2017  . B12 deficiency 11/13/2016  . Balance problems 11/13/2016  . OSA (obstructive sleep apnea) 04/28/2015  . Low serum vitamin D 03/24/2015  . Dementia 01/19/2015  . REM sleep behavior disorder 01/19/2015  . Hypothyroid 01/19/2015  . Snoring 01/19/2015  . Excessive daytime sleepiness 01/19/2015  . Transaminasemia 03/26/2012  . GERD (gastroesophageal reflux disease) 09/25/2011  . Asbestosis(501) 09/25/2011    Past Surgical History:  Procedure Laterality Date  . CHOLECYSTECTOMY     1994 [place]: gallstones  . ESOPHAGOGASTRODUODENOSCOPY  03/30/2011   Procedure:  ESOPHAGOGASTRODUODENOSCOPY (EGD);  Surgeon: Rogene Houston, MD;  Location: [facility]ITE;  Service: Endoscopy;  Laterality: N/A;  . EYE SURGERY  April 2015   Ruptured vessel in the back of your right eye  . KNEE CARTILAGE SURGERY     Bilateral        Home Medications    Prior to Admission medications   Medication Sig Start Date End Date Taking? Authorizing Provider  aspirin 81 MG chewable tablet Chew 1 tablet (81 mg total) by mouth daily. 08/06/17  Yes Oretha Milch D, MD  calcium-vitamin D 250-100 MG-UNIT tablet Take 1 tablet by mouth daily. 08/06/17  Yes Oretha Milch D, MD  clonazePAM (KLONOPIN) 0.5 MG tablet Take 0.5 mg by mouth 2 (two) times daily.   Yes [provider]  levothyroxine (SYNTHROID, LEVOTHROID) 100 MCG tablet Take 1 tablet (100 mcg total) by mouth daily before breakfast. 08/06/17  Yes Oretha Milch D, MD  mouth rinse LIQD solution 15 mLs by Mouth Rinse route 2 (two) times daily. 08/06/17  Yes Oretha Milch D, MD  sertraline (ZOLOFT) 50 MG tablet Take 50 mg by mouth daily.   Yes [provider]  Vitamin D, Ergocalciferol, (DRISDOL) 50000 units CAPS capsule Take 1 capsule (50,000 Units total) by mouth every 7 (seven) days. Once done with prescription, take over the counter Vitamin D 2,000u once daily 08/06/17  Yes Oretha Milch D, MD  food thickener (THICK IT) POWD Use as need with meals 08/06/17  Desiree Hane, MD    Family History Family History  Problem Relation Age of Onset  . Heart attack Father     Social History Social History   Tobacco Use  . Smoking status: Former Smoker    Packs/day: 1.00    Start date: 10/01/2011  . Smokeless tobacco: Never Used  . Tobacco comment: 1 pack a day  Substance Use Topics  . Alcohol use: No  . Drug use: No     Allergies   Celebrex [celecoxib]   Review of Systems Review of Systems  Unable to perform ROS: Dementia  Constitutional: Negative for appetite change.  Respiratory: Negative for  shortness of breath.   Cardiovascular: Negative for chest pain.  Musculoskeletal: Negative for neck pain.     Physical Exam Updated Vital Signs BP 130/75   Pulse (!) 51   Temp 98.1 F (36.7 C) (Oral)   Resp 16   SpO2 97%   Physical Exam  Constitutional: He appears well-developed.  HENT:  Abrasion to right side of forehead.  No underlying tenderness.  Eyes: Pupils are equal, round, and reactive to light.  Neck: Neck supple.  No midline cervical tenderness.  Cardiovascular: Normal rate.  Pulmonary/Chest: Effort normal.  Abdominal: There is no tenderness.  Musculoskeletal: He exhibits no tenderness.  Neurological: He is alert.  Alert and will answer questions, but some mild confusion.  Skin: Skin is warm. Capillary refill takes less than 2 seconds.  Psychiatric: His behavior is normal.     ED Treatments / Results  Labs (all labs ordered are listed, but only abnormal results are displayed) Labs Reviewed - No data to display  EKG None  Radiology Ct Head Wo Contrast  Result Date: 01/02/2018 CLINICAL DATA:  Unwitnessed fall today. Bruising of the right forehead. Dementia. EXAM: CT HEAD WITHOUT CONTRAST TECHNIQUE: Contiguous axial images were obtained from the base of the skull through the vertex without intravenous contrast. COMPARISON:  MRI and CT 08/01/2017 FINDINGS: Brain: Mild generalized atrophy. Small old lacunar infarctions in the basal ganglia regions. No evidence of acute infarction, mass lesion, hemorrhage, hydrocephalus or extra-axial collection. Vascular: There is atherosclerotic calcification of the major vessels at the base of the brain. Skull: No skull fracture. Sinuses/Orbits: Clear/normal Other: None IMPRESSION: No acute or traumatic finding. Mild generalized atrophy. No hemorrhage. No skull fracture. Electronically Signed   By: Nelson Chimes M.D.   On: 01/02/2018 11:59    Procedures Procedures (including critical care time)  Medications Ordered in  ED Medications - No data to display   Initial Impression / Assessment and Plan / ED Course  I have reviewed the triage vital signs and the nursing notes.  Pertinent labs & imaging results that were available during my care of the patient were reviewed by me and considered in my medical decision making (see chart for details).     Patient with fall.  History of dementia.  Head CT reassuring.  No other apparent injury.  Sounds more mechanical fall.  Discharge home.  Final Clinical Impressions(s) / ED Diagnoses   Final diagnoses:  Fall, initial encounter    ED Discharge Orders    None       Davonna Belling, MD 01/02/18 1309

## 2018-01-03 ENCOUNTER — Other Ambulatory Visit: Payer: Self-pay | Admitting: Licensed Clinical Social Worker

## 2018-01-03 DIAGNOSIS — F4322 Adjustment disorder with anxiety: Secondary | ICD-10-CM | POA: Diagnosis not present

## 2018-01-03 DIAGNOSIS — F0391 Unspecified dementia with behavioral disturbance: Secondary | ICD-10-CM | POA: Diagnosis not present

## 2018-01-03 DIAGNOSIS — F5102 Adjustment insomnia: Secondary | ICD-10-CM | POA: Diagnosis not present

## 2018-01-03 NOTE — Patient Outreach (Signed)
Assessment:  CSW spoke via phone with Ruel Favors, son of client. CSW verified identity of Kevron Patella. CSW received verbal permission from Rasean Joos on 01/03/18 for CSW to speak with Eddie about client needs. Client had previously resided at [facility] and Nursing in [place], [place].  Client had transferred from that facility to [facility] in [place], [place].  Client is now residing on a [place] hall at [facility] in [place] , [place]. Client has family support.   Client is receiving nursing care as needed at facility.  Client is eating adequately and sleeping adequately.  Wray Goehring is Health Care Power of Attorney for client.  CSW was informed that client does enjoy participating in activities of choice at facility.  Client receives nursing support at facility. Client is also receiving behavioral health support at facility.  Client is taking medications as prescribed. Client has recently completed physical therapy sessions for client at facility. CSW spoke with Ludwig Clarks about client care plan.  CSW encouraged that client continue to cooperate with all facility care providers and care givers at facility helping client with client care needs.    Plan:  Client to continue to cooperate in next 30 days  with all facility care providers and care givers at facility helping client with client care needs.  CSW to call client or Soloman Mckeithan, son of client in 4 weeks to assess needs of client at that time.  Norva Riffle.Necia Kamm MSW, LCSW Licensed Clinical Social Worker [facility] Care Management [phone]

## 2018-01-21 DIAGNOSIS — M150 Primary generalized (osteo)arthritis: Secondary | ICD-10-CM | POA: Diagnosis not present

## 2018-01-21 DIAGNOSIS — E039 Hypothyroidism, unspecified: Secondary | ICD-10-CM | POA: Diagnosis not present

## 2018-01-21 DIAGNOSIS — F0391 Unspecified dementia with behavioral disturbance: Secondary | ICD-10-CM | POA: Diagnosis not present

## 2018-01-23 DIAGNOSIS — R41841 Cognitive communication deficit: Secondary | ICD-10-CM | POA: Diagnosis not present

## 2018-01-23 DIAGNOSIS — M150 Primary generalized (osteo)arthritis: Secondary | ICD-10-CM | POA: Diagnosis not present

## 2018-01-23 DIAGNOSIS — I739 Peripheral vascular disease, unspecified: Secondary | ICD-10-CM | POA: Diagnosis not present

## 2018-01-23 DIAGNOSIS — R2689 Other abnormalities of gait and mobility: Secondary | ICD-10-CM | POA: Diagnosis not present

## 2018-01-23 DIAGNOSIS — F0391 Unspecified dementia with behavioral disturbance: Secondary | ICD-10-CM | POA: Diagnosis not present

## 2018-01-23 DIAGNOSIS — F259 Schizoaffective disorder, unspecified: Secondary | ICD-10-CM | POA: Diagnosis not present

## 2018-01-24 DIAGNOSIS — M150 Primary generalized (osteo)arthritis: Secondary | ICD-10-CM | POA: Diagnosis not present

## 2018-01-24 DIAGNOSIS — R2689 Other abnormalities of gait and mobility: Secondary | ICD-10-CM | POA: Diagnosis not present

## 2018-01-24 DIAGNOSIS — I739 Peripheral vascular disease, unspecified: Secondary | ICD-10-CM | POA: Diagnosis not present

## 2018-01-24 DIAGNOSIS — F0391 Unspecified dementia with behavioral disturbance: Secondary | ICD-10-CM | POA: Diagnosis not present

## 2018-01-24 DIAGNOSIS — R41841 Cognitive communication deficit: Secondary | ICD-10-CM | POA: Diagnosis not present

## 2018-01-24 DIAGNOSIS — F259 Schizoaffective disorder, unspecified: Secondary | ICD-10-CM | POA: Diagnosis not present

## 2018-01-30 DIAGNOSIS — R41841 Cognitive communication deficit: Secondary | ICD-10-CM | POA: Diagnosis not present

## 2018-01-30 DIAGNOSIS — F259 Schizoaffective disorder, unspecified: Secondary | ICD-10-CM | POA: Diagnosis not present

## 2018-01-30 DIAGNOSIS — F0391 Unspecified dementia with behavioral disturbance: Secondary | ICD-10-CM | POA: Diagnosis not present

## 2018-01-30 DIAGNOSIS — M150 Primary generalized (osteo)arthritis: Secondary | ICD-10-CM | POA: Diagnosis not present

## 2018-01-30 DIAGNOSIS — R2689 Other abnormalities of gait and mobility: Secondary | ICD-10-CM | POA: Diagnosis not present

## 2018-01-30 DIAGNOSIS — I739 Peripheral vascular disease, unspecified: Secondary | ICD-10-CM | POA: Diagnosis not present

## 2018-01-31 DIAGNOSIS — M150 Primary generalized (osteo)arthritis: Secondary | ICD-10-CM | POA: Diagnosis not present

## 2018-01-31 DIAGNOSIS — F259 Schizoaffective disorder, unspecified: Secondary | ICD-10-CM | POA: Diagnosis not present

## 2018-01-31 DIAGNOSIS — F0391 Unspecified dementia with behavioral disturbance: Secondary | ICD-10-CM | POA: Diagnosis not present

## 2018-01-31 DIAGNOSIS — R2689 Other abnormalities of gait and mobility: Secondary | ICD-10-CM | POA: Diagnosis not present

## 2018-01-31 DIAGNOSIS — R41841 Cognitive communication deficit: Secondary | ICD-10-CM | POA: Diagnosis not present

## 2018-01-31 DIAGNOSIS — I739 Peripheral vascular disease, unspecified: Secondary | ICD-10-CM | POA: Diagnosis not present

## 2018-02-01 DIAGNOSIS — R2689 Other abnormalities of gait and mobility: Secondary | ICD-10-CM | POA: Diagnosis not present

## 2018-02-01 DIAGNOSIS — F259 Schizoaffective disorder, unspecified: Secondary | ICD-10-CM | POA: Diagnosis not present

## 2018-02-01 DIAGNOSIS — I739 Peripheral vascular disease, unspecified: Secondary | ICD-10-CM | POA: Diagnosis not present

## 2018-02-01 DIAGNOSIS — R41841 Cognitive communication deficit: Secondary | ICD-10-CM | POA: Diagnosis not present

## 2018-02-01 DIAGNOSIS — F0391 Unspecified dementia with behavioral disturbance: Secondary | ICD-10-CM | POA: Diagnosis not present

## 2018-02-01 DIAGNOSIS — M150 Primary generalized (osteo)arthritis: Secondary | ICD-10-CM | POA: Diagnosis not present

## 2018-02-04 DIAGNOSIS — R2689 Other abnormalities of gait and mobility: Secondary | ICD-10-CM | POA: Diagnosis not present

## 2018-02-04 DIAGNOSIS — M150 Primary generalized (osteo)arthritis: Secondary | ICD-10-CM | POA: Diagnosis not present

## 2018-02-04 DIAGNOSIS — I739 Peripheral vascular disease, unspecified: Secondary | ICD-10-CM | POA: Diagnosis not present

## 2018-02-04 DIAGNOSIS — R41841 Cognitive communication deficit: Secondary | ICD-10-CM | POA: Diagnosis not present

## 2018-02-04 DIAGNOSIS — F0391 Unspecified dementia with behavioral disturbance: Secondary | ICD-10-CM | POA: Diagnosis not present

## 2018-02-04 DIAGNOSIS — F259 Schizoaffective disorder, unspecified: Secondary | ICD-10-CM | POA: Diagnosis not present

## 2018-02-05 ENCOUNTER — Encounter: Payer: Self-pay | Admitting: Licensed Clinical Social Worker

## 2018-02-05 ENCOUNTER — Other Ambulatory Visit: Payer: Self-pay | Admitting: Licensed Clinical Social Worker

## 2018-02-05 DIAGNOSIS — F0391 Unspecified dementia with behavioral disturbance: Secondary | ICD-10-CM | POA: Diagnosis not present

## 2018-02-05 DIAGNOSIS — F5102 Adjustment insomnia: Secondary | ICD-10-CM | POA: Diagnosis not present

## 2018-02-05 DIAGNOSIS — M2011 Hallux valgus (acquired), right foot: Secondary | ICD-10-CM | POA: Diagnosis not present

## 2018-02-05 DIAGNOSIS — B351 Tinea unguium: Secondary | ICD-10-CM | POA: Diagnosis not present

## 2018-02-05 DIAGNOSIS — F419 Anxiety disorder, unspecified: Secondary | ICD-10-CM | POA: Diagnosis not present

## 2018-02-05 NOTE — Patient Outreach (Signed)
Assessment:  CSW had talked previously with client's son, Jonathon Grimes about client needs and care concerns.  Jonathon Grimes is [place] of Attorney for client. Client previously had received care at [facility] and Nursing in [place], [place].  Client had discharged from that care facility and admitted to [facility] facility in [place], [place].  [facility] facility has a dementia care unit and client has been receiving care in dementia care unit at [facility] facility.  Client has also been receiving behavioral health support while on dementia care unit.  CSW called [facility] on 02/05/18 and spoke via phone with Jonathon Grimes, Resident Care Coordinator at [facility] facility. CSW introduced self to [facility] and talked with Jonathon Grimes about client status and needs.  Jonathon Grimes said that client was doing well at facility. She said he participated occasionally in social activities or group activities. She said that [facility] provider had met with client today and was monitoring behavioral health needs of client. Client is eating well and sleeping adequately. Jonathon Grimes said that the plan for client is for client to remain at [facility] facility for long term care. She said that client was now well adjusted to facility and plan is for client to stay at facility for long term care.  CSW thanked [employer]  for update information on Jonathon Grimes.     Plan:  CSW is discharging Jonathon Grimes, from [facility] CSW services on 02/05/18 since client will remain at [facility] facility for long term care.  CSW to fax physician case closure letter to Dr. Quintin Alto informing Dr. Quintin Alto that [facility] discharged client on 02/05/18 from [facility] CSW services.   Norva Riffle.Korea Severs MSW, LCSW Licensed Clinical Social Worker [facility] Care Management [phone]

## 2018-02-06 DIAGNOSIS — M150 Primary generalized (osteo)arthritis: Secondary | ICD-10-CM | POA: Diagnosis not present

## 2018-02-06 DIAGNOSIS — F0391 Unspecified dementia with behavioral disturbance: Secondary | ICD-10-CM | POA: Diagnosis not present

## 2018-02-06 DIAGNOSIS — F259 Schizoaffective disorder, unspecified: Secondary | ICD-10-CM | POA: Diagnosis not present

## 2018-02-06 DIAGNOSIS — R2689 Other abnormalities of gait and mobility: Secondary | ICD-10-CM | POA: Diagnosis not present

## 2018-02-06 DIAGNOSIS — I739 Peripheral vascular disease, unspecified: Secondary | ICD-10-CM | POA: Diagnosis not present

## 2018-02-06 DIAGNOSIS — R41841 Cognitive communication deficit: Secondary | ICD-10-CM | POA: Diagnosis not present

## 2018-02-07 DIAGNOSIS — R41841 Cognitive communication deficit: Secondary | ICD-10-CM | POA: Diagnosis not present

## 2018-02-07 DIAGNOSIS — R2689 Other abnormalities of gait and mobility: Secondary | ICD-10-CM | POA: Diagnosis not present

## 2018-02-07 DIAGNOSIS — M150 Primary generalized (osteo)arthritis: Secondary | ICD-10-CM | POA: Diagnosis not present

## 2018-02-07 DIAGNOSIS — I739 Peripheral vascular disease, unspecified: Secondary | ICD-10-CM | POA: Diagnosis not present

## 2018-02-07 DIAGNOSIS — F0391 Unspecified dementia with behavioral disturbance: Secondary | ICD-10-CM | POA: Diagnosis not present

## 2018-02-07 DIAGNOSIS — F259 Schizoaffective disorder, unspecified: Secondary | ICD-10-CM | POA: Diagnosis not present

## 2018-02-08 DIAGNOSIS — R41841 Cognitive communication deficit: Secondary | ICD-10-CM | POA: Diagnosis not present

## 2018-02-08 DIAGNOSIS — F0391 Unspecified dementia with behavioral disturbance: Secondary | ICD-10-CM | POA: Diagnosis not present

## 2018-02-08 DIAGNOSIS — I739 Peripheral vascular disease, unspecified: Secondary | ICD-10-CM | POA: Diagnosis not present

## 2018-02-08 DIAGNOSIS — F259 Schizoaffective disorder, unspecified: Secondary | ICD-10-CM | POA: Diagnosis not present

## 2018-02-08 DIAGNOSIS — R2689 Other abnormalities of gait and mobility: Secondary | ICD-10-CM | POA: Diagnosis not present

## 2018-02-08 DIAGNOSIS — M150 Primary generalized (osteo)arthritis: Secondary | ICD-10-CM | POA: Diagnosis not present

## 2018-02-11 DIAGNOSIS — I739 Peripheral vascular disease, unspecified: Secondary | ICD-10-CM | POA: Diagnosis not present

## 2018-02-11 DIAGNOSIS — R2689 Other abnormalities of gait and mobility: Secondary | ICD-10-CM | POA: Diagnosis not present

## 2018-02-11 DIAGNOSIS — F0391 Unspecified dementia with behavioral disturbance: Secondary | ICD-10-CM | POA: Diagnosis not present

## 2018-02-11 DIAGNOSIS — M150 Primary generalized (osteo)arthritis: Secondary | ICD-10-CM | POA: Diagnosis not present

## 2018-02-11 DIAGNOSIS — F259 Schizoaffective disorder, unspecified: Secondary | ICD-10-CM | POA: Diagnosis not present

## 2018-02-11 DIAGNOSIS — R41841 Cognitive communication deficit: Secondary | ICD-10-CM | POA: Diagnosis not present

## 2018-02-12 DIAGNOSIS — R2689 Other abnormalities of gait and mobility: Secondary | ICD-10-CM | POA: Diagnosis not present

## 2018-02-12 DIAGNOSIS — F0391 Unspecified dementia with behavioral disturbance: Secondary | ICD-10-CM | POA: Diagnosis not present

## 2018-02-12 DIAGNOSIS — I739 Peripheral vascular disease, unspecified: Secondary | ICD-10-CM | POA: Diagnosis not present

## 2018-02-12 DIAGNOSIS — R41841 Cognitive communication deficit: Secondary | ICD-10-CM | POA: Diagnosis not present

## 2018-02-12 DIAGNOSIS — M150 Primary generalized (osteo)arthritis: Secondary | ICD-10-CM | POA: Diagnosis not present

## 2018-02-12 DIAGNOSIS — F259 Schizoaffective disorder, unspecified: Secondary | ICD-10-CM | POA: Diagnosis not present

## 2018-02-14 DIAGNOSIS — M150 Primary generalized (osteo)arthritis: Secondary | ICD-10-CM | POA: Diagnosis not present

## 2018-02-14 DIAGNOSIS — R2689 Other abnormalities of gait and mobility: Secondary | ICD-10-CM | POA: Diagnosis not present

## 2018-02-14 DIAGNOSIS — F259 Schizoaffective disorder, unspecified: Secondary | ICD-10-CM | POA: Diagnosis not present

## 2018-02-14 DIAGNOSIS — I739 Peripheral vascular disease, unspecified: Secondary | ICD-10-CM | POA: Diagnosis not present

## 2018-02-14 DIAGNOSIS — R41841 Cognitive communication deficit: Secondary | ICD-10-CM | POA: Diagnosis not present

## 2018-02-14 DIAGNOSIS — F0391 Unspecified dementia with behavioral disturbance: Secondary | ICD-10-CM | POA: Diagnosis not present

## 2018-02-18 DIAGNOSIS — I739 Peripheral vascular disease, unspecified: Secondary | ICD-10-CM | POA: Diagnosis not present

## 2018-02-18 DIAGNOSIS — R41841 Cognitive communication deficit: Secondary | ICD-10-CM | POA: Diagnosis not present

## 2018-02-18 DIAGNOSIS — F0391 Unspecified dementia with behavioral disturbance: Secondary | ICD-10-CM | POA: Diagnosis not present

## 2018-02-18 DIAGNOSIS — R2689 Other abnormalities of gait and mobility: Secondary | ICD-10-CM | POA: Diagnosis not present

## 2018-02-18 DIAGNOSIS — M150 Primary generalized (osteo)arthritis: Secondary | ICD-10-CM | POA: Diagnosis not present

## 2018-02-18 DIAGNOSIS — F259 Schizoaffective disorder, unspecified: Secondary | ICD-10-CM | POA: Diagnosis not present

## 2018-02-19 DIAGNOSIS — F0391 Unspecified dementia with behavioral disturbance: Secondary | ICD-10-CM | POA: Diagnosis not present

## 2018-02-19 DIAGNOSIS — R41841 Cognitive communication deficit: Secondary | ICD-10-CM | POA: Diagnosis not present

## 2018-02-19 DIAGNOSIS — M150 Primary generalized (osteo)arthritis: Secondary | ICD-10-CM | POA: Diagnosis not present

## 2018-02-19 DIAGNOSIS — F259 Schizoaffective disorder, unspecified: Secondary | ICD-10-CM | POA: Diagnosis not present

## 2018-02-19 DIAGNOSIS — I739 Peripheral vascular disease, unspecified: Secondary | ICD-10-CM | POA: Diagnosis not present

## 2018-02-19 DIAGNOSIS — R2689 Other abnormalities of gait and mobility: Secondary | ICD-10-CM | POA: Diagnosis not present

## 2018-02-21 DIAGNOSIS — E039 Hypothyroidism, unspecified: Secondary | ICD-10-CM | POA: Diagnosis not present

## 2018-02-21 DIAGNOSIS — M150 Primary generalized (osteo)arthritis: Secondary | ICD-10-CM | POA: Diagnosis not present

## 2018-02-21 DIAGNOSIS — F0391 Unspecified dementia with behavioral disturbance: Secondary | ICD-10-CM | POA: Diagnosis not present

## 2018-02-22 DIAGNOSIS — I739 Peripheral vascular disease, unspecified: Secondary | ICD-10-CM | POA: Diagnosis not present

## 2018-02-22 DIAGNOSIS — R41841 Cognitive communication deficit: Secondary | ICD-10-CM | POA: Diagnosis not present

## 2018-02-22 DIAGNOSIS — M150 Primary generalized (osteo)arthritis: Secondary | ICD-10-CM | POA: Diagnosis not present

## 2018-02-22 DIAGNOSIS — R2689 Other abnormalities of gait and mobility: Secondary | ICD-10-CM | POA: Diagnosis not present

## 2018-02-22 DIAGNOSIS — F0391 Unspecified dementia with behavioral disturbance: Secondary | ICD-10-CM | POA: Diagnosis not present

## 2018-02-22 DIAGNOSIS — F259 Schizoaffective disorder, unspecified: Secondary | ICD-10-CM | POA: Diagnosis not present

## 2018-03-05 DIAGNOSIS — Z23 Encounter for immunization: Secondary | ICD-10-CM | POA: Diagnosis not present

## 2018-03-06 DIAGNOSIS — F5102 Adjustment insomnia: Secondary | ICD-10-CM | POA: Diagnosis not present

## 2018-03-06 DIAGNOSIS — F419 Anxiety disorder, unspecified: Secondary | ICD-10-CM | POA: Diagnosis not present

## 2018-03-06 DIAGNOSIS — F0391 Unspecified dementia with behavioral disturbance: Secondary | ICD-10-CM | POA: Diagnosis not present

## 2018-03-08 DIAGNOSIS — E7849 Other hyperlipidemia: Secondary | ICD-10-CM | POA: Diagnosis not present

## 2018-03-08 DIAGNOSIS — D518 Other vitamin B12 deficiency anemias: Secondary | ICD-10-CM | POA: Diagnosis not present

## 2018-03-08 DIAGNOSIS — E559 Vitamin D deficiency, unspecified: Secondary | ICD-10-CM | POA: Diagnosis not present

## 2018-03-08 DIAGNOSIS — Z79899 Other long term (current) drug therapy: Secondary | ICD-10-CM | POA: Diagnosis not present

## 2018-03-08 DIAGNOSIS — E038 Other specified hypothyroidism: Secondary | ICD-10-CM | POA: Diagnosis not present

## 2018-03-08 DIAGNOSIS — E119 Type 2 diabetes mellitus without complications: Secondary | ICD-10-CM | POA: Diagnosis not present

## 2018-03-21 DIAGNOSIS — F0391 Unspecified dementia with behavioral disturbance: Secondary | ICD-10-CM | POA: Diagnosis not present

## 2018-03-21 DIAGNOSIS — E039 Hypothyroidism, unspecified: Secondary | ICD-10-CM | POA: Diagnosis not present

## 2018-03-21 DIAGNOSIS — R739 Hyperglycemia, unspecified: Secondary | ICD-10-CM | POA: Diagnosis not present

## 2018-03-22 DIAGNOSIS — N390 Urinary tract infection, site not specified: Secondary | ICD-10-CM | POA: Diagnosis not present

## 2018-04-04 DIAGNOSIS — E559 Vitamin D deficiency, unspecified: Secondary | ICD-10-CM | POA: Diagnosis not present

## 2018-04-04 DIAGNOSIS — R739 Hyperglycemia, unspecified: Secondary | ICD-10-CM | POA: Diagnosis not present

## 2018-04-04 DIAGNOSIS — F0391 Unspecified dementia with behavioral disturbance: Secondary | ICD-10-CM | POA: Diagnosis not present

## 2018-04-05 DIAGNOSIS — D518 Other vitamin B12 deficiency anemias: Secondary | ICD-10-CM | POA: Diagnosis not present

## 2018-04-05 DIAGNOSIS — E119 Type 2 diabetes mellitus without complications: Secondary | ICD-10-CM | POA: Diagnosis not present

## 2018-04-05 DIAGNOSIS — E7849 Other hyperlipidemia: Secondary | ICD-10-CM | POA: Diagnosis not present

## 2018-04-05 DIAGNOSIS — Z79899 Other long term (current) drug therapy: Secondary | ICD-10-CM | POA: Diagnosis not present

## 2018-04-07 DIAGNOSIS — R441 Visual hallucinations: Secondary | ICD-10-CM | POA: Diagnosis not present

## 2018-04-07 DIAGNOSIS — R4182 Altered mental status, unspecified: Secondary | ICD-10-CM | POA: Diagnosis not present

## 2018-04-07 DIAGNOSIS — R443 Hallucinations, unspecified: Secondary | ICD-10-CM | POA: Diagnosis not present

## 2018-04-07 DIAGNOSIS — R451 Restlessness and agitation: Secondary | ICD-10-CM | POA: Diagnosis not present

## 2018-04-07 DIAGNOSIS — R440 Auditory hallucinations: Secondary | ICD-10-CM | POA: Diagnosis not present

## 2018-04-09 DIAGNOSIS — Z9181 History of falling: Secondary | ICD-10-CM | POA: Diagnosis not present

## 2018-04-09 DIAGNOSIS — K219 Gastro-esophageal reflux disease without esophagitis: Secondary | ICD-10-CM | POA: Diagnosis not present

## 2018-04-09 DIAGNOSIS — F0281 Dementia in other diseases classified elsewhere with behavioral disturbance: Secondary | ICD-10-CM | POA: Diagnosis not present

## 2018-04-09 DIAGNOSIS — R740 Nonspecific elevation of levels of transaminase and lactic acid dehydrogenase [LDH]: Secondary | ICD-10-CM | POA: Diagnosis not present

## 2018-04-09 DIAGNOSIS — D539 Nutritional anemia, unspecified: Secondary | ICD-10-CM | POA: Diagnosis not present

## 2018-04-09 DIAGNOSIS — G4730 Sleep apnea, unspecified: Secondary | ICD-10-CM | POA: Diagnosis not present

## 2018-04-09 DIAGNOSIS — G3183 Dementia with Lewy bodies: Secondary | ICD-10-CM | POA: Diagnosis not present

## 2018-04-09 DIAGNOSIS — N179 Acute kidney failure, unspecified: Secondary | ICD-10-CM | POA: Diagnosis not present

## 2018-04-09 DIAGNOSIS — E538 Deficiency of other specified B group vitamins: Secondary | ICD-10-CM | POA: Diagnosis not present

## 2018-04-09 DIAGNOSIS — E039 Hypothyroidism, unspecified: Secondary | ICD-10-CM | POA: Diagnosis not present

## 2018-04-10 DIAGNOSIS — F0281 Dementia in other diseases classified elsewhere with behavioral disturbance: Secondary | ICD-10-CM | POA: Diagnosis not present

## 2018-04-10 DIAGNOSIS — G3183 Dementia with Lewy bodies: Secondary | ICD-10-CM | POA: Diagnosis not present

## 2018-04-11 DIAGNOSIS — Z7189 Other specified counseling: Secondary | ICD-10-CM | POA: Diagnosis not present

## 2018-04-11 DIAGNOSIS — Z515 Encounter for palliative care: Secondary | ICD-10-CM | POA: Diagnosis not present

## 2018-04-11 DIAGNOSIS — G3183 Dementia with Lewy bodies: Secondary | ICD-10-CM | POA: Diagnosis not present

## 2018-04-11 DIAGNOSIS — Z9181 History of falling: Secondary | ICD-10-CM | POA: Diagnosis not present

## 2018-04-11 DIAGNOSIS — R740 Nonspecific elevation of levels of transaminase and lactic acid dehydrogenase [LDH]: Secondary | ICD-10-CM | POA: Diagnosis not present

## 2018-04-11 DIAGNOSIS — F0281 Dementia in other diseases classified elsewhere with behavioral disturbance: Secondary | ICD-10-CM | POA: Diagnosis not present

## 2018-04-11 DIAGNOSIS — G4730 Sleep apnea, unspecified: Secondary | ICD-10-CM | POA: Diagnosis not present

## 2018-04-11 DIAGNOSIS — N179 Acute kidney failure, unspecified: Secondary | ICD-10-CM | POA: Diagnosis not present

## 2018-04-11 DIAGNOSIS — E538 Deficiency of other specified B group vitamins: Secondary | ICD-10-CM | POA: Diagnosis not present

## 2018-04-12 DIAGNOSIS — G3183 Dementia with Lewy bodies: Secondary | ICD-10-CM | POA: Diagnosis not present

## 2018-04-12 DIAGNOSIS — F0281 Dementia in other diseases classified elsewhere with behavioral disturbance: Secondary | ICD-10-CM | POA: Diagnosis not present

## 2018-04-13 DIAGNOSIS — G2571 Drug induced akathisia: Secondary | ICD-10-CM | POA: Diagnosis not present

## 2018-04-13 DIAGNOSIS — D539 Nutritional anemia, unspecified: Secondary | ICD-10-CM | POA: Diagnosis not present

## 2018-04-13 DIAGNOSIS — E538 Deficiency of other specified B group vitamins: Secondary | ICD-10-CM | POA: Diagnosis not present

## 2018-04-13 DIAGNOSIS — Z9189 Other specified personal risk factors, not elsewhere classified: Secondary | ICD-10-CM | POA: Diagnosis not present

## 2018-04-13 DIAGNOSIS — F0281 Dementia in other diseases classified elsewhere with behavioral disturbance: Secondary | ICD-10-CM | POA: Diagnosis not present

## 2018-04-13 DIAGNOSIS — Z9181 History of falling: Secondary | ICD-10-CM | POA: Diagnosis not present

## 2018-04-13 DIAGNOSIS — R740 Nonspecific elevation of levels of transaminase and lactic acid dehydrogenase [LDH]: Secondary | ICD-10-CM | POA: Diagnosis not present

## 2018-04-13 DIAGNOSIS — G3183 Dementia with Lewy bodies: Secondary | ICD-10-CM | POA: Diagnosis not present

## 2018-04-13 DIAGNOSIS — N179 Acute kidney failure, unspecified: Secondary | ICD-10-CM | POA: Diagnosis not present

## 2018-04-14 DIAGNOSIS — G3183 Dementia with Lewy bodies: Secondary | ICD-10-CM | POA: Diagnosis not present

## 2018-04-14 DIAGNOSIS — F0281 Dementia in other diseases classified elsewhere with behavioral disturbance: Secondary | ICD-10-CM | POA: Diagnosis not present

## 2018-04-14 DIAGNOSIS — N179 Acute kidney failure, unspecified: Secondary | ICD-10-CM | POA: Diagnosis not present

## 2018-04-14 DIAGNOSIS — Z9189 Other specified personal risk factors, not elsewhere classified: Secondary | ICD-10-CM | POA: Diagnosis not present

## 2018-04-14 DIAGNOSIS — R748 Abnormal levels of other serum enzymes: Secondary | ICD-10-CM | POA: Diagnosis not present

## 2018-04-14 DIAGNOSIS — G2571 Drug induced akathisia: Secondary | ICD-10-CM | POA: Diagnosis not present

## 2018-04-15 DIAGNOSIS — R740 Nonspecific elevation of levels of transaminase and lactic acid dehydrogenase [LDH]: Secondary | ICD-10-CM | POA: Diagnosis not present

## 2018-04-15 DIAGNOSIS — K219 Gastro-esophageal reflux disease without esophagitis: Secondary | ICD-10-CM | POA: Diagnosis not present

## 2018-04-15 DIAGNOSIS — G9340 Encephalopathy, unspecified: Secondary | ICD-10-CM | POA: Diagnosis not present

## 2018-04-15 DIAGNOSIS — N179 Acute kidney failure, unspecified: Secondary | ICD-10-CM | POA: Diagnosis not present

## 2018-04-15 DIAGNOSIS — E039 Hypothyroidism, unspecified: Secondary | ICD-10-CM | POA: Diagnosis not present

## 2018-04-15 DIAGNOSIS — E538 Deficiency of other specified B group vitamins: Secondary | ICD-10-CM | POA: Diagnosis not present

## 2018-04-15 DIAGNOSIS — Z9189 Other specified personal risk factors, not elsewhere classified: Secondary | ICD-10-CM | POA: Diagnosis not present

## 2018-04-15 DIAGNOSIS — G2571 Drug induced akathisia: Secondary | ICD-10-CM | POA: Diagnosis not present

## 2018-04-15 DIAGNOSIS — R748 Abnormal levels of other serum enzymes: Secondary | ICD-10-CM | POA: Diagnosis not present

## 2018-04-15 DIAGNOSIS — G3183 Dementia with Lewy bodies: Secondary | ICD-10-CM | POA: Diagnosis not present

## 2018-04-15 DIAGNOSIS — Z9181 History of falling: Secondary | ICD-10-CM | POA: Diagnosis not present

## 2018-04-15 DIAGNOSIS — F0281 Dementia in other diseases classified elsewhere with behavioral disturbance: Secondary | ICD-10-CM | POA: Diagnosis not present

## 2018-04-16 DIAGNOSIS — E538 Deficiency of other specified B group vitamins: Secondary | ICD-10-CM | POA: Diagnosis not present

## 2018-04-16 DIAGNOSIS — R740 Nonspecific elevation of levels of transaminase and lactic acid dehydrogenase [LDH]: Secondary | ICD-10-CM | POA: Diagnosis not present

## 2018-04-16 DIAGNOSIS — Z515 Encounter for palliative care: Secondary | ICD-10-CM | POA: Diagnosis not present

## 2018-04-16 DIAGNOSIS — G253 Myoclonus: Secondary | ICD-10-CM | POA: Diagnosis not present

## 2018-04-16 DIAGNOSIS — G2571 Drug induced akathisia: Secondary | ICD-10-CM | POA: Diagnosis not present

## 2018-04-16 DIAGNOSIS — Z7189 Other specified counseling: Secondary | ICD-10-CM | POA: Diagnosis not present

## 2018-04-16 DIAGNOSIS — F0281 Dementia in other diseases classified elsewhere with behavioral disturbance: Secondary | ICD-10-CM | POA: Diagnosis not present

## 2018-04-16 DIAGNOSIS — G3183 Dementia with Lewy bodies: Secondary | ICD-10-CM | POA: Diagnosis not present

## 2018-04-16 DIAGNOSIS — R253 Fasciculation: Secondary | ICD-10-CM | POA: Diagnosis not present

## 2018-04-16 DIAGNOSIS — G4730 Sleep apnea, unspecified: Secondary | ICD-10-CM | POA: Diagnosis not present

## 2018-04-17 DIAGNOSIS — G9340 Encephalopathy, unspecified: Secondary | ICD-10-CM | POA: Diagnosis not present

## 2018-04-17 DIAGNOSIS — R740 Nonspecific elevation of levels of transaminase and lactic acid dehydrogenase [LDH]: Secondary | ICD-10-CM | POA: Diagnosis not present

## 2018-04-17 DIAGNOSIS — Z9181 History of falling: Secondary | ICD-10-CM | POA: Diagnosis not present

## 2018-04-17 DIAGNOSIS — G3183 Dementia with Lewy bodies: Secondary | ICD-10-CM | POA: Diagnosis not present

## 2018-04-17 DIAGNOSIS — G253 Myoclonus: Secondary | ICD-10-CM | POA: Diagnosis not present

## 2018-04-17 DIAGNOSIS — E538 Deficiency of other specified B group vitamins: Secondary | ICD-10-CM | POA: Diagnosis not present

## 2018-04-17 DIAGNOSIS — Z9189 Other specified personal risk factors, not elsewhere classified: Secondary | ICD-10-CM | POA: Diagnosis not present

## 2018-04-17 DIAGNOSIS — R748 Abnormal levels of other serum enzymes: Secondary | ICD-10-CM | POA: Diagnosis not present

## 2018-04-17 DIAGNOSIS — N179 Acute kidney failure, unspecified: Secondary | ICD-10-CM | POA: Diagnosis not present

## 2018-04-17 DIAGNOSIS — F0281 Dementia in other diseases classified elsewhere with behavioral disturbance: Secondary | ICD-10-CM | POA: Diagnosis not present

## 2018-04-17 DIAGNOSIS — G2571 Drug induced akathisia: Secondary | ICD-10-CM | POA: Diagnosis not present

## 2018-04-17 DIAGNOSIS — E039 Hypothyroidism, unspecified: Secondary | ICD-10-CM | POA: Diagnosis not present

## 2018-04-17 DIAGNOSIS — K219 Gastro-esophageal reflux disease without esophagitis: Secondary | ICD-10-CM | POA: Diagnosis not present

## 2018-04-18 DIAGNOSIS — R740 Nonspecific elevation of levels of transaminase and lactic acid dehydrogenase [LDH]: Secondary | ICD-10-CM | POA: Diagnosis not present

## 2018-04-18 DIAGNOSIS — G253 Myoclonus: Secondary | ICD-10-CM | POA: Diagnosis not present

## 2018-04-18 DIAGNOSIS — G9340 Encephalopathy, unspecified: Secondary | ICD-10-CM | POA: Diagnosis not present

## 2018-04-18 DIAGNOSIS — F0281 Dementia in other diseases classified elsewhere with behavioral disturbance: Secondary | ICD-10-CM | POA: Diagnosis not present

## 2018-04-18 DIAGNOSIS — G3183 Dementia with Lewy bodies: Secondary | ICD-10-CM | POA: Diagnosis not present

## 2018-04-18 DIAGNOSIS — R748 Abnormal levels of other serum enzymes: Secondary | ICD-10-CM | POA: Diagnosis not present

## 2018-04-18 DIAGNOSIS — E538 Deficiency of other specified B group vitamins: Secondary | ICD-10-CM | POA: Diagnosis not present

## 2018-04-18 DIAGNOSIS — K219 Gastro-esophageal reflux disease without esophagitis: Secondary | ICD-10-CM | POA: Diagnosis not present

## 2018-04-18 DIAGNOSIS — E039 Hypothyroidism, unspecified: Secondary | ICD-10-CM | POA: Diagnosis not present

## 2018-04-18 DIAGNOSIS — Z9181 History of falling: Secondary | ICD-10-CM | POA: Diagnosis not present

## 2018-04-18 DIAGNOSIS — N179 Acute kidney failure, unspecified: Secondary | ICD-10-CM | POA: Diagnosis not present

## 2018-04-18 DIAGNOSIS — Z9189 Other specified personal risk factors, not elsewhere classified: Secondary | ICD-10-CM | POA: Diagnosis not present

## 2018-04-18 DIAGNOSIS — G2571 Drug induced akathisia: Secondary | ICD-10-CM | POA: Diagnosis not present

## 2018-04-19 DIAGNOSIS — G210 Malignant neuroleptic syndrome: Secondary | ICD-10-CM | POA: Diagnosis not present

## 2018-04-19 DIAGNOSIS — G3183 Dementia with Lewy bodies: Secondary | ICD-10-CM | POA: Diagnosis not present

## 2018-04-19 DIAGNOSIS — R740 Nonspecific elevation of levels of transaminase and lactic acid dehydrogenase [LDH]: Secondary | ICD-10-CM | POA: Diagnosis not present

## 2018-04-19 DIAGNOSIS — F0281 Dementia in other diseases classified elsewhere with behavioral disturbance: Secondary | ICD-10-CM | POA: Diagnosis not present

## 2018-04-19 DIAGNOSIS — Z7982 Long term (current) use of aspirin: Secondary | ICD-10-CM | POA: Diagnosis not present

## 2018-04-19 DIAGNOSIS — Z9189 Other specified personal risk factors, not elsewhere classified: Secondary | ICD-10-CM | POA: Diagnosis not present

## 2018-04-19 DIAGNOSIS — E538 Deficiency of other specified B group vitamins: Secondary | ICD-10-CM | POA: Diagnosis not present

## 2018-04-19 DIAGNOSIS — G9340 Encephalopathy, unspecified: Secondary | ICD-10-CM | POA: Diagnosis not present

## 2018-04-19 DIAGNOSIS — K219 Gastro-esophageal reflux disease without esophagitis: Secondary | ICD-10-CM | POA: Diagnosis not present

## 2018-04-19 DIAGNOSIS — Z66 Do not resuscitate: Secondary | ICD-10-CM | POA: Diagnosis not present

## 2018-04-19 DIAGNOSIS — N179 Acute kidney failure, unspecified: Secondary | ICD-10-CM | POA: Diagnosis not present

## 2018-04-19 DIAGNOSIS — Z515 Encounter for palliative care: Secondary | ICD-10-CM | POA: Diagnosis not present

## 2018-04-19 DIAGNOSIS — D539 Nutritional anemia, unspecified: Secondary | ICD-10-CM | POA: Diagnosis not present

## 2018-04-19 DIAGNOSIS — E039 Hypothyroidism, unspecified: Secondary | ICD-10-CM | POA: Diagnosis not present

## 2018-04-19 DIAGNOSIS — Z9181 History of falling: Secondary | ICD-10-CM | POA: Diagnosis not present

## 2018-04-19 DIAGNOSIS — G253 Myoclonus: Secondary | ICD-10-CM | POA: Diagnosis not present

## 2018-04-19 DIAGNOSIS — R7989 Other specified abnormal findings of blood chemistry: Secondary | ICD-10-CM | POA: Diagnosis not present

## 2018-04-19 DIAGNOSIS — R748 Abnormal levels of other serum enzymes: Secondary | ICD-10-CM | POA: Diagnosis not present

## 2018-04-20 DIAGNOSIS — G3183 Dementia with Lewy bodies: Secondary | ICD-10-CM | POA: Diagnosis not present

## 2018-04-20 DIAGNOSIS — F0281 Dementia in other diseases classified elsewhere with behavioral disturbance: Secondary | ICD-10-CM | POA: Diagnosis not present

## 2018-04-21 DIAGNOSIS — F0281 Dementia in other diseases classified elsewhere with behavioral disturbance: Secondary | ICD-10-CM | POA: Diagnosis not present

## 2018-04-21 DIAGNOSIS — G3183 Dementia with Lewy bodies: Secondary | ICD-10-CM | POA: Diagnosis not present

## 2018-04-22 DIAGNOSIS — D539 Nutritional anemia, unspecified: Secondary | ICD-10-CM | POA: Diagnosis not present

## 2018-04-22 DIAGNOSIS — R748 Abnormal levels of other serum enzymes: Secondary | ICD-10-CM | POA: Diagnosis not present

## 2018-04-22 DIAGNOSIS — F0281 Dementia in other diseases classified elsewhere with behavioral disturbance: Secondary | ICD-10-CM | POA: Diagnosis not present

## 2018-04-22 DIAGNOSIS — Z9181 History of falling: Secondary | ICD-10-CM | POA: Diagnosis not present

## 2018-04-22 DIAGNOSIS — G2571 Drug induced akathisia: Secondary | ICD-10-CM | POA: Diagnosis not present

## 2018-04-22 DIAGNOSIS — G3183 Dementia with Lewy bodies: Secondary | ICD-10-CM | POA: Diagnosis not present

## 2018-04-22 DIAGNOSIS — E538 Deficiency of other specified B group vitamins: Secondary | ICD-10-CM | POA: Diagnosis not present

## 2018-04-23 DIAGNOSIS — Z9181 History of falling: Secondary | ICD-10-CM | POA: Diagnosis not present

## 2018-04-23 DIAGNOSIS — Z515 Encounter for palliative care: Secondary | ICD-10-CM | POA: Diagnosis not present

## 2018-04-23 DIAGNOSIS — D539 Nutritional anemia, unspecified: Secondary | ICD-10-CM | POA: Diagnosis not present

## 2018-04-23 DIAGNOSIS — Z9189 Other specified personal risk factors, not elsewhere classified: Secondary | ICD-10-CM | POA: Diagnosis not present

## 2018-04-23 DIAGNOSIS — F0281 Dementia in other diseases classified elsewhere with behavioral disturbance: Secondary | ICD-10-CM | POA: Diagnosis not present

## 2018-04-23 DIAGNOSIS — Z7189 Other specified counseling: Secondary | ICD-10-CM | POA: Diagnosis not present

## 2018-04-23 DIAGNOSIS — R740 Nonspecific elevation of levels of transaminase and lactic acid dehydrogenase [LDH]: Secondary | ICD-10-CM | POA: Diagnosis not present

## 2018-04-23 DIAGNOSIS — G3183 Dementia with Lewy bodies: Secondary | ICD-10-CM | POA: Diagnosis not present

## 2018-04-23 DIAGNOSIS — R748 Abnormal levels of other serum enzymes: Secondary | ICD-10-CM | POA: Diagnosis not present

## 2018-04-23 DIAGNOSIS — G2571 Drug induced akathisia: Secondary | ICD-10-CM | POA: Diagnosis not present

## 2018-04-24 DIAGNOSIS — G3183 Dementia with Lewy bodies: Secondary | ICD-10-CM | POA: Diagnosis not present

## 2018-04-24 DIAGNOSIS — Z9189 Other specified personal risk factors, not elsewhere classified: Secondary | ICD-10-CM | POA: Diagnosis not present

## 2018-04-24 DIAGNOSIS — F0281 Dementia in other diseases classified elsewhere with behavioral disturbance: Secondary | ICD-10-CM | POA: Diagnosis not present

## 2018-04-24 DIAGNOSIS — G4730 Sleep apnea, unspecified: Secondary | ICD-10-CM | POA: Diagnosis not present

## 2018-04-24 DIAGNOSIS — R740 Nonspecific elevation of levels of transaminase and lactic acid dehydrogenase [LDH]: Secondary | ICD-10-CM | POA: Diagnosis not present

## 2018-04-24 DIAGNOSIS — R1311 Dysphagia, oral phase: Secondary | ICD-10-CM | POA: Diagnosis not present

## 2018-04-24 DIAGNOSIS — E538 Deficiency of other specified B group vitamins: Secondary | ICD-10-CM | POA: Diagnosis not present

## 2018-04-24 DIAGNOSIS — Z515 Encounter for palliative care: Secondary | ICD-10-CM | POA: Diagnosis not present

## 2018-04-24 DIAGNOSIS — Z7189 Other specified counseling: Secondary | ICD-10-CM | POA: Diagnosis not present

## 2018-04-25 DIAGNOSIS — R740 Nonspecific elevation of levels of transaminase and lactic acid dehydrogenase [LDH]: Secondary | ICD-10-CM | POA: Diagnosis not present

## 2018-04-25 DIAGNOSIS — D539 Nutritional anemia, unspecified: Secondary | ICD-10-CM | POA: Diagnosis not present

## 2018-04-25 DIAGNOSIS — R748 Abnormal levels of other serum enzymes: Secondary | ICD-10-CM | POA: Diagnosis not present

## 2018-04-25 DIAGNOSIS — Z9181 History of falling: Secondary | ICD-10-CM | POA: Diagnosis not present

## 2018-04-25 DIAGNOSIS — G3183 Dementia with Lewy bodies: Secondary | ICD-10-CM | POA: Diagnosis not present

## 2018-04-25 DIAGNOSIS — G2571 Drug induced akathisia: Secondary | ICD-10-CM | POA: Diagnosis not present

## 2018-04-25 DIAGNOSIS — E538 Deficiency of other specified B group vitamins: Secondary | ICD-10-CM | POA: Diagnosis not present

## 2018-04-25 DIAGNOSIS — F0281 Dementia in other diseases classified elsewhere with behavioral disturbance: Secondary | ICD-10-CM | POA: Diagnosis not present

## 2018-04-25 DIAGNOSIS — Z9189 Other specified personal risk factors, not elsewhere classified: Secondary | ICD-10-CM | POA: Diagnosis not present

## 2018-04-26 DIAGNOSIS — F0281 Dementia in other diseases classified elsewhere with behavioral disturbance: Secondary | ICD-10-CM | POA: Diagnosis not present

## 2018-04-26 DIAGNOSIS — G3183 Dementia with Lewy bodies: Secondary | ICD-10-CM | POA: Diagnosis not present

## 2018-04-27 DIAGNOSIS — G319 Degenerative disease of nervous system, unspecified: Secondary | ICD-10-CM | POA: Diagnosis not present

## 2018-04-27 DIAGNOSIS — G3183 Dementia with Lewy bodies: Secondary | ICD-10-CM | POA: Diagnosis not present

## 2018-04-27 DIAGNOSIS — I6782 Cerebral ischemia: Secondary | ICD-10-CM | POA: Diagnosis not present

## 2018-04-27 DIAGNOSIS — R740 Nonspecific elevation of levels of transaminase and lactic acid dehydrogenase [LDH]: Secondary | ICD-10-CM | POA: Diagnosis not present

## 2018-04-27 DIAGNOSIS — R4182 Altered mental status, unspecified: Secondary | ICD-10-CM | POA: Diagnosis not present

## 2018-04-27 DIAGNOSIS — Z9189 Other specified personal risk factors, not elsewhere classified: Secondary | ICD-10-CM | POA: Diagnosis not present

## 2018-04-27 DIAGNOSIS — M1612 Unilateral primary osteoarthritis, left hip: Secondary | ICD-10-CM | POA: Diagnosis not present

## 2018-04-27 DIAGNOSIS — M25552 Pain in left hip: Secondary | ICD-10-CM | POA: Diagnosis not present

## 2018-04-27 DIAGNOSIS — M50323 Other cervical disc degeneration at C6-C7 level: Secondary | ICD-10-CM | POA: Diagnosis not present

## 2018-04-27 DIAGNOSIS — M419 Scoliosis, unspecified: Secondary | ICD-10-CM | POA: Diagnosis not present

## 2018-04-27 DIAGNOSIS — M47892 Other spondylosis, cervical region: Secondary | ICD-10-CM | POA: Diagnosis not present

## 2018-04-27 DIAGNOSIS — D539 Nutritional anemia, unspecified: Secondary | ICD-10-CM | POA: Diagnosis not present

## 2018-04-27 DIAGNOSIS — S0990XA Unspecified injury of head, initial encounter: Secondary | ICD-10-CM | POA: Diagnosis not present

## 2018-04-27 DIAGNOSIS — F0281 Dementia in other diseases classified elsewhere with behavioral disturbance: Secondary | ICD-10-CM | POA: Diagnosis not present

## 2018-04-27 DIAGNOSIS — G2571 Drug induced akathisia: Secondary | ICD-10-CM | POA: Diagnosis not present

## 2018-04-27 DIAGNOSIS — E538 Deficiency of other specified B group vitamins: Secondary | ICD-10-CM | POA: Diagnosis not present

## 2018-04-27 DIAGNOSIS — Z9181 History of falling: Secondary | ICD-10-CM | POA: Diagnosis not present

## 2018-04-27 DIAGNOSIS — R748 Abnormal levels of other serum enzymes: Secondary | ICD-10-CM | POA: Diagnosis not present

## 2018-04-28 DIAGNOSIS — F0391 Unspecified dementia with behavioral disturbance: Secondary | ICD-10-CM | POA: Diagnosis not present

## 2018-04-28 DIAGNOSIS — F419 Anxiety disorder, unspecified: Secondary | ICD-10-CM | POA: Diagnosis not present

## 2018-04-28 DIAGNOSIS — J61 Pneumoconiosis due to asbestos and other mineral fibers: Secondary | ICD-10-CM | POA: Diagnosis not present

## 2018-04-28 DIAGNOSIS — Z79899 Other long term (current) drug therapy: Secondary | ICD-10-CM | POA: Diagnosis not present

## 2018-04-28 DIAGNOSIS — R29818 Other symptoms and signs involving the nervous system: Secondary | ICD-10-CM | POA: Diagnosis not present

## 2018-04-28 DIAGNOSIS — Z7982 Long term (current) use of aspirin: Secondary | ICD-10-CM | POA: Diagnosis not present

## 2018-04-28 DIAGNOSIS — E039 Hypothyroidism, unspecified: Secondary | ICD-10-CM | POA: Diagnosis not present

## 2018-04-28 DIAGNOSIS — K219 Gastro-esophageal reflux disease without esophagitis: Secondary | ICD-10-CM | POA: Diagnosis not present

## 2018-04-28 DIAGNOSIS — D509 Iron deficiency anemia, unspecified: Secondary | ICD-10-CM | POA: Diagnosis not present

## 2018-04-28 DIAGNOSIS — M19042 Primary osteoarthritis, left hand: Secondary | ICD-10-CM | POA: Diagnosis not present

## 2018-04-28 DIAGNOSIS — F259 Schizoaffective disorder, unspecified: Secondary | ICD-10-CM | POA: Diagnosis not present

## 2018-04-28 DIAGNOSIS — S62611A Displaced fracture of proximal phalanx of left index finger, initial encounter for closed fracture: Secondary | ICD-10-CM | POA: Diagnosis not present

## 2018-04-28 DIAGNOSIS — G9340 Encephalopathy, unspecified: Secondary | ICD-10-CM | POA: Diagnosis not present

## 2018-04-28 DIAGNOSIS — G4730 Sleep apnea, unspecified: Secondary | ICD-10-CM | POA: Diagnosis not present

## 2018-04-29 DIAGNOSIS — F259 Schizoaffective disorder, unspecified: Secondary | ICD-10-CM | POA: Diagnosis not present

## 2018-04-29 DIAGNOSIS — F0391 Unspecified dementia with behavioral disturbance: Secondary | ICD-10-CM | POA: Diagnosis not present

## 2018-04-29 DIAGNOSIS — E039 Hypothyroidism, unspecified: Secondary | ICD-10-CM | POA: Diagnosis not present

## 2018-04-29 DIAGNOSIS — G3183 Dementia with Lewy bodies: Secondary | ICD-10-CM | POA: Diagnosis not present

## 2018-04-29 DIAGNOSIS — R29818 Other symptoms and signs involving the nervous system: Secondary | ICD-10-CM | POA: Diagnosis not present

## 2018-04-29 DIAGNOSIS — F419 Anxiety disorder, unspecified: Secondary | ICD-10-CM | POA: Diagnosis not present

## 2018-04-29 DIAGNOSIS — E538 Deficiency of other specified B group vitamins: Secondary | ICD-10-CM | POA: Diagnosis not present

## 2018-04-29 DIAGNOSIS — R4182 Altered mental status, unspecified: Secondary | ICD-10-CM | POA: Diagnosis not present

## 2018-04-29 DIAGNOSIS — R748 Abnormal levels of other serum enzymes: Secondary | ICD-10-CM | POA: Diagnosis not present

## 2018-04-29 DIAGNOSIS — S62611A Displaced fracture of proximal phalanx of left index finger, initial encounter for closed fracture: Secondary | ICD-10-CM | POA: Diagnosis not present

## 2018-04-29 DIAGNOSIS — S62641A Nondisplaced fracture of proximal phalanx of left index finger, initial encounter for closed fracture: Secondary | ICD-10-CM | POA: Diagnosis not present

## 2018-04-29 DIAGNOSIS — K219 Gastro-esophageal reflux disease without esophagitis: Secondary | ICD-10-CM | POA: Diagnosis not present

## 2018-04-29 DIAGNOSIS — G9340 Encephalopathy, unspecified: Secondary | ICD-10-CM | POA: Diagnosis not present

## 2018-04-29 DIAGNOSIS — F0281 Dementia in other diseases classified elsewhere with behavioral disturbance: Secondary | ICD-10-CM | POA: Diagnosis not present

## 2018-04-30 DIAGNOSIS — F0391 Unspecified dementia with behavioral disturbance: Secondary | ICD-10-CM | POA: Diagnosis not present

## 2018-04-30 DIAGNOSIS — D539 Nutritional anemia, unspecified: Secondary | ICD-10-CM | POA: Diagnosis not present

## 2018-04-30 DIAGNOSIS — G4730 Sleep apnea, unspecified: Secondary | ICD-10-CM | POA: Diagnosis not present

## 2018-04-30 DIAGNOSIS — Z9181 History of falling: Secondary | ICD-10-CM | POA: Diagnosis not present

## 2018-04-30 DIAGNOSIS — G9340 Encephalopathy, unspecified: Secondary | ICD-10-CM | POA: Diagnosis not present

## 2018-04-30 DIAGNOSIS — S62611D Displaced fracture of proximal phalanx of left index finger, subsequent encounter for fracture with routine healing: Secondary | ICD-10-CM | POA: Diagnosis not present

## 2018-04-30 DIAGNOSIS — R404 Transient alteration of awareness: Secondary | ICD-10-CM | POA: Diagnosis not present

## 2018-04-30 DIAGNOSIS — G253 Myoclonus: Secondary | ICD-10-CM | POA: Diagnosis not present

## 2018-04-30 DIAGNOSIS — W19XXXA Unspecified fall, initial encounter: Secondary | ICD-10-CM | POA: Diagnosis not present

## 2018-04-30 DIAGNOSIS — E039 Hypothyroidism, unspecified: Secondary | ICD-10-CM | POA: Diagnosis not present

## 2018-04-30 DIAGNOSIS — R748 Abnormal levels of other serum enzymes: Secondary | ICD-10-CM | POA: Diagnosis not present

## 2018-04-30 DIAGNOSIS — E538 Deficiency of other specified B group vitamins: Secondary | ICD-10-CM | POA: Diagnosis not present

## 2018-04-30 DIAGNOSIS — Y92239 Unspecified place in hospital as the place of occurrence of the external cause: Secondary | ICD-10-CM | POA: Diagnosis not present

## 2018-05-01 DIAGNOSIS — F0391 Unspecified dementia with behavioral disturbance: Secondary | ICD-10-CM | POA: Diagnosis not present

## 2018-05-02 DIAGNOSIS — R404 Transient alteration of awareness: Secondary | ICD-10-CM | POA: Diagnosis not present

## 2018-05-02 DIAGNOSIS — R748 Abnormal levels of other serum enzymes: Secondary | ICD-10-CM | POA: Diagnosis not present

## 2018-05-02 DIAGNOSIS — S62611D Displaced fracture of proximal phalanx of left index finger, subsequent encounter for fracture with routine healing: Secondary | ICD-10-CM | POA: Diagnosis not present

## 2018-05-02 DIAGNOSIS — Z9181 History of falling: Secondary | ICD-10-CM | POA: Diagnosis not present

## 2018-05-02 DIAGNOSIS — E039 Hypothyroidism, unspecified: Secondary | ICD-10-CM | POA: Diagnosis not present

## 2018-05-02 DIAGNOSIS — W19XXXA Unspecified fall, initial encounter: Secondary | ICD-10-CM | POA: Diagnosis not present

## 2018-05-02 DIAGNOSIS — D539 Nutritional anemia, unspecified: Secondary | ICD-10-CM | POA: Diagnosis not present

## 2018-05-02 DIAGNOSIS — Y92239 Unspecified place in hospital as the place of occurrence of the external cause: Secondary | ICD-10-CM | POA: Diagnosis not present

## 2018-05-02 DIAGNOSIS — G4730 Sleep apnea, unspecified: Secondary | ICD-10-CM | POA: Diagnosis not present

## 2018-05-02 DIAGNOSIS — F0391 Unspecified dementia with behavioral disturbance: Secondary | ICD-10-CM | POA: Diagnosis not present

## 2018-05-02 DIAGNOSIS — E538 Deficiency of other specified B group vitamins: Secondary | ICD-10-CM | POA: Diagnosis not present

## 2018-05-02 DIAGNOSIS — G9340 Encephalopathy, unspecified: Secondary | ICD-10-CM | POA: Diagnosis not present

## 2018-05-02 DIAGNOSIS — G253 Myoclonus: Secondary | ICD-10-CM | POA: Diagnosis not present

## 2018-05-03 DIAGNOSIS — F0391 Unspecified dementia with behavioral disturbance: Secondary | ICD-10-CM | POA: Diagnosis not present

## 2018-05-04 DIAGNOSIS — F0391 Unspecified dementia with behavioral disturbance: Secondary | ICD-10-CM | POA: Diagnosis not present

## 2018-05-05 DIAGNOSIS — F0391 Unspecified dementia with behavioral disturbance: Secondary | ICD-10-CM | POA: Diagnosis not present

## 2018-05-06 DIAGNOSIS — F0391 Unspecified dementia with behavioral disturbance: Secondary | ICD-10-CM | POA: Diagnosis not present

## 2018-05-07 DIAGNOSIS — R740 Nonspecific elevation of levels of transaminase and lactic acid dehydrogenase [LDH]: Secondary | ICD-10-CM | POA: Diagnosis not present

## 2018-05-07 DIAGNOSIS — F0391 Unspecified dementia with behavioral disturbance: Secondary | ICD-10-CM | POA: Diagnosis not present

## 2018-05-07 DIAGNOSIS — D539 Nutritional anemia, unspecified: Secondary | ICD-10-CM | POA: Diagnosis not present

## 2018-05-07 DIAGNOSIS — G9340 Encephalopathy, unspecified: Secondary | ICD-10-CM | POA: Diagnosis not present

## 2018-05-07 DIAGNOSIS — S62611D Displaced fracture of proximal phalanx of left index finger, subsequent encounter for fracture with routine healing: Secondary | ICD-10-CM | POA: Diagnosis not present

## 2018-05-07 DIAGNOSIS — Z9189 Other specified personal risk factors, not elsewhere classified: Secondary | ICD-10-CM | POA: Diagnosis not present

## 2018-05-07 DIAGNOSIS — Z9181 History of falling: Secondary | ICD-10-CM | POA: Diagnosis not present

## 2018-05-08 DIAGNOSIS — F0391 Unspecified dementia with behavioral disturbance: Secondary | ICD-10-CM | POA: Diagnosis not present

## 2018-05-09 DIAGNOSIS — F0391 Unspecified dementia with behavioral disturbance: Secondary | ICD-10-CM | POA: Diagnosis not present

## 2018-05-10 DIAGNOSIS — G9340 Encephalopathy, unspecified: Secondary | ICD-10-CM | POA: Diagnosis not present

## 2018-05-10 DIAGNOSIS — D539 Nutritional anemia, unspecified: Secondary | ICD-10-CM | POA: Diagnosis not present

## 2018-05-10 DIAGNOSIS — Z9181 History of falling: Secondary | ICD-10-CM | POA: Diagnosis not present

## 2018-05-10 DIAGNOSIS — R918 Other nonspecific abnormal finding of lung field: Secondary | ICD-10-CM | POA: Diagnosis not present

## 2018-05-10 DIAGNOSIS — Z9189 Other specified personal risk factors, not elsewhere classified: Secondary | ICD-10-CM | POA: Diagnosis not present

## 2018-05-10 DIAGNOSIS — F0391 Unspecified dementia with behavioral disturbance: Secondary | ICD-10-CM | POA: Diagnosis not present

## 2018-05-10 DIAGNOSIS — R001 Bradycardia, unspecified: Secondary | ICD-10-CM | POA: Diagnosis not present

## 2018-05-10 DIAGNOSIS — R410 Disorientation, unspecified: Secondary | ICD-10-CM | POA: Diagnosis not present

## 2018-05-10 DIAGNOSIS — R740 Nonspecific elevation of levels of transaminase and lactic acid dehydrogenase [LDH]: Secondary | ICD-10-CM | POA: Diagnosis not present

## 2018-05-11 DIAGNOSIS — F0391 Unspecified dementia with behavioral disturbance: Secondary | ICD-10-CM | POA: Diagnosis not present

## 2018-05-12 DIAGNOSIS — F0391 Unspecified dementia with behavioral disturbance: Secondary | ICD-10-CM | POA: Diagnosis not present

## 2018-05-12 DIAGNOSIS — Z0389 Encounter for observation for other suspected diseases and conditions ruled out: Secondary | ICD-10-CM | POA: Diagnosis not present

## 2018-05-13 DIAGNOSIS — D539 Nutritional anemia, unspecified: Secondary | ICD-10-CM | POA: Diagnosis not present

## 2018-05-13 DIAGNOSIS — F0391 Unspecified dementia with behavioral disturbance: Secondary | ICD-10-CM | POA: Diagnosis not present

## 2018-05-13 DIAGNOSIS — G253 Myoclonus: Secondary | ICD-10-CM | POA: Diagnosis not present

## 2018-05-13 DIAGNOSIS — Z9181 History of falling: Secondary | ICD-10-CM | POA: Diagnosis not present

## 2018-05-13 DIAGNOSIS — S62611D Displaced fracture of proximal phalanx of left index finger, subsequent encounter for fracture with routine healing: Secondary | ICD-10-CM | POA: Diagnosis not present

## 2018-05-13 DIAGNOSIS — R404 Transient alteration of awareness: Secondary | ICD-10-CM | POA: Diagnosis not present

## 2018-05-13 DIAGNOSIS — W19XXXA Unspecified fall, initial encounter: Secondary | ICD-10-CM | POA: Diagnosis not present

## 2018-05-13 DIAGNOSIS — R748 Abnormal levels of other serum enzymes: Secondary | ICD-10-CM | POA: Diagnosis not present

## 2018-05-13 DIAGNOSIS — Y92239 Unspecified place in hospital as the place of occurrence of the external cause: Secondary | ICD-10-CM | POA: Diagnosis not present

## 2018-05-13 DIAGNOSIS — E039 Hypothyroidism, unspecified: Secondary | ICD-10-CM | POA: Diagnosis not present

## 2018-05-13 DIAGNOSIS — E538 Deficiency of other specified B group vitamins: Secondary | ICD-10-CM | POA: Diagnosis not present

## 2018-05-13 DIAGNOSIS — G9340 Encephalopathy, unspecified: Secondary | ICD-10-CM | POA: Diagnosis not present

## 2018-05-13 DIAGNOSIS — H1031 Unspecified acute conjunctivitis, right eye: Secondary | ICD-10-CM | POA: Diagnosis not present

## 2018-05-15 DIAGNOSIS — Z9181 History of falling: Secondary | ICD-10-CM | POA: Diagnosis not present

## 2018-05-15 DIAGNOSIS — F0391 Unspecified dementia with behavioral disturbance: Secondary | ICD-10-CM | POA: Diagnosis not present

## 2018-05-15 DIAGNOSIS — W19XXXA Unspecified fall, initial encounter: Secondary | ICD-10-CM | POA: Diagnosis not present

## 2018-05-15 DIAGNOSIS — R404 Transient alteration of awareness: Secondary | ICD-10-CM | POA: Diagnosis not present

## 2018-05-15 DIAGNOSIS — Y92239 Unspecified place in hospital as the place of occurrence of the external cause: Secondary | ICD-10-CM | POA: Diagnosis not present

## 2018-05-15 DIAGNOSIS — G9340 Encephalopathy, unspecified: Secondary | ICD-10-CM | POA: Diagnosis not present

## 2018-05-15 DIAGNOSIS — H1031 Unspecified acute conjunctivitis, right eye: Secondary | ICD-10-CM | POA: Diagnosis not present

## 2018-05-15 DIAGNOSIS — G253 Myoclonus: Secondary | ICD-10-CM | POA: Diagnosis not present

## 2018-05-15 DIAGNOSIS — D539 Nutritional anemia, unspecified: Secondary | ICD-10-CM | POA: Diagnosis not present

## 2018-05-15 DIAGNOSIS — S62611D Displaced fracture of proximal phalanx of left index finger, subsequent encounter for fracture with routine healing: Secondary | ICD-10-CM | POA: Diagnosis not present

## 2018-05-15 DIAGNOSIS — R748 Abnormal levels of other serum enzymes: Secondary | ICD-10-CM | POA: Diagnosis not present

## 2018-05-15 DIAGNOSIS — E039 Hypothyroidism, unspecified: Secondary | ICD-10-CM | POA: Diagnosis not present

## 2018-05-15 DIAGNOSIS — E538 Deficiency of other specified B group vitamins: Secondary | ICD-10-CM | POA: Diagnosis not present

## 2018-05-16 DIAGNOSIS — F0391 Unspecified dementia with behavioral disturbance: Secondary | ICD-10-CM | POA: Diagnosis not present

## 2018-05-17 DIAGNOSIS — W19XXXA Unspecified fall, initial encounter: Secondary | ICD-10-CM | POA: Diagnosis not present

## 2018-05-17 DIAGNOSIS — R748 Abnormal levels of other serum enzymes: Secondary | ICD-10-CM | POA: Diagnosis not present

## 2018-05-17 DIAGNOSIS — G9340 Encephalopathy, unspecified: Secondary | ICD-10-CM | POA: Diagnosis not present

## 2018-05-17 DIAGNOSIS — E039 Hypothyroidism, unspecified: Secondary | ICD-10-CM | POA: Diagnosis not present

## 2018-05-17 DIAGNOSIS — E538 Deficiency of other specified B group vitamins: Secondary | ICD-10-CM | POA: Diagnosis not present

## 2018-05-17 DIAGNOSIS — F0391 Unspecified dementia with behavioral disturbance: Secondary | ICD-10-CM | POA: Diagnosis not present

## 2018-05-17 DIAGNOSIS — Z9181 History of falling: Secondary | ICD-10-CM | POA: Diagnosis not present

## 2018-05-17 DIAGNOSIS — Y92239 Unspecified place in hospital as the place of occurrence of the external cause: Secondary | ICD-10-CM | POA: Diagnosis not present

## 2018-05-17 DIAGNOSIS — H1031 Unspecified acute conjunctivitis, right eye: Secondary | ICD-10-CM | POA: Diagnosis not present

## 2018-05-17 DIAGNOSIS — G253 Myoclonus: Secondary | ICD-10-CM | POA: Diagnosis not present

## 2018-05-17 DIAGNOSIS — R1310 Dysphagia, unspecified: Secondary | ICD-10-CM | POA: Diagnosis not present

## 2018-05-17 DIAGNOSIS — S62611D Displaced fracture of proximal phalanx of left index finger, subsequent encounter for fracture with routine healing: Secondary | ICD-10-CM | POA: Diagnosis not present

## 2018-05-17 DIAGNOSIS — D539 Nutritional anemia, unspecified: Secondary | ICD-10-CM | POA: Diagnosis not present

## 2018-05-17 DIAGNOSIS — R404 Transient alteration of awareness: Secondary | ICD-10-CM | POA: Diagnosis not present

## 2018-05-17 DIAGNOSIS — R05 Cough: Secondary | ICD-10-CM | POA: Diagnosis not present

## 2018-05-18 DIAGNOSIS — F0391 Unspecified dementia with behavioral disturbance: Secondary | ICD-10-CM | POA: Diagnosis not present

## 2018-05-19 DIAGNOSIS — E538 Deficiency of other specified B group vitamins: Secondary | ICD-10-CM | POA: Diagnosis not present

## 2018-05-19 DIAGNOSIS — F0391 Unspecified dementia with behavioral disturbance: Secondary | ICD-10-CM | POA: Diagnosis not present

## 2018-05-19 DIAGNOSIS — S62611D Displaced fracture of proximal phalanx of left index finger, subsequent encounter for fracture with routine healing: Secondary | ICD-10-CM | POA: Diagnosis not present

## 2018-05-19 DIAGNOSIS — G9340 Encephalopathy, unspecified: Secondary | ICD-10-CM | POA: Diagnosis not present

## 2018-05-19 DIAGNOSIS — E039 Hypothyroidism, unspecified: Secondary | ICD-10-CM | POA: Diagnosis not present

## 2018-05-19 DIAGNOSIS — R740 Nonspecific elevation of levels of transaminase and lactic acid dehydrogenase [LDH]: Secondary | ICD-10-CM | POA: Diagnosis not present

## 2018-05-19 DIAGNOSIS — Z9189 Other specified personal risk factors, not elsewhere classified: Secondary | ICD-10-CM | POA: Diagnosis not present

## 2018-05-19 DIAGNOSIS — K5909 Other constipation: Secondary | ICD-10-CM | POA: Diagnosis not present

## 2018-05-19 DIAGNOSIS — Z9181 History of falling: Secondary | ICD-10-CM | POA: Diagnosis not present

## 2018-05-19 DIAGNOSIS — D539 Nutritional anemia, unspecified: Secondary | ICD-10-CM | POA: Diagnosis not present

## 2018-05-19 DIAGNOSIS — K219 Gastro-esophageal reflux disease without esophagitis: Secondary | ICD-10-CM | POA: Diagnosis not present

## 2018-05-20 DIAGNOSIS — F0391 Unspecified dementia with behavioral disturbance: Secondary | ICD-10-CM | POA: Diagnosis not present

## 2018-05-21 DIAGNOSIS — E039 Hypothyroidism, unspecified: Secondary | ICD-10-CM | POA: Diagnosis not present

## 2018-05-21 DIAGNOSIS — G253 Myoclonus: Secondary | ICD-10-CM | POA: Diagnosis not present

## 2018-05-21 DIAGNOSIS — G9340 Encephalopathy, unspecified: Secondary | ICD-10-CM | POA: Diagnosis not present

## 2018-05-21 DIAGNOSIS — F0391 Unspecified dementia with behavioral disturbance: Secondary | ICD-10-CM | POA: Diagnosis not present

## 2018-05-21 DIAGNOSIS — D539 Nutritional anemia, unspecified: Secondary | ICD-10-CM | POA: Diagnosis not present

## 2018-05-21 DIAGNOSIS — E538 Deficiency of other specified B group vitamins: Secondary | ICD-10-CM | POA: Diagnosis not present

## 2018-05-21 DIAGNOSIS — S62611D Displaced fracture of proximal phalanx of left index finger, subsequent encounter for fracture with routine healing: Secondary | ICD-10-CM | POA: Diagnosis not present

## 2018-05-21 DIAGNOSIS — W19XXXA Unspecified fall, initial encounter: Secondary | ICD-10-CM | POA: Diagnosis not present

## 2018-05-21 DIAGNOSIS — R404 Transient alteration of awareness: Secondary | ICD-10-CM | POA: Diagnosis not present

## 2018-05-21 DIAGNOSIS — R748 Abnormal levels of other serum enzymes: Secondary | ICD-10-CM | POA: Diagnosis not present

## 2018-05-21 DIAGNOSIS — Y92239 Unspecified place in hospital as the place of occurrence of the external cause: Secondary | ICD-10-CM | POA: Diagnosis not present

## 2018-05-21 DIAGNOSIS — Z9181 History of falling: Secondary | ICD-10-CM | POA: Diagnosis not present

## 2018-05-22 DIAGNOSIS — F0391 Unspecified dementia with behavioral disturbance: Secondary | ICD-10-CM | POA: Diagnosis not present

## 2018-05-23 DIAGNOSIS — Z9181 History of falling: Secondary | ICD-10-CM | POA: Diagnosis not present

## 2018-05-23 DIAGNOSIS — R740 Nonspecific elevation of levels of transaminase and lactic acid dehydrogenase [LDH]: Secondary | ICD-10-CM | POA: Diagnosis not present

## 2018-05-23 DIAGNOSIS — E871 Hypo-osmolality and hyponatremia: Secondary | ICD-10-CM | POA: Diagnosis not present

## 2018-05-23 DIAGNOSIS — F0391 Unspecified dementia with behavioral disturbance: Secondary | ICD-10-CM | POA: Diagnosis not present

## 2018-05-23 DIAGNOSIS — K5909 Other constipation: Secondary | ICD-10-CM | POA: Diagnosis not present

## 2018-05-23 DIAGNOSIS — Z9189 Other specified personal risk factors, not elsewhere classified: Secondary | ICD-10-CM | POA: Diagnosis not present

## 2018-05-24 DIAGNOSIS — F0391 Unspecified dementia with behavioral disturbance: Secondary | ICD-10-CM | POA: Diagnosis not present

## 2018-05-25 DIAGNOSIS — I959 Hypotension, unspecified: Secondary | ICD-10-CM | POA: Diagnosis not present

## 2018-05-25 DIAGNOSIS — Z9181 History of falling: Secondary | ICD-10-CM | POA: Diagnosis not present

## 2018-05-25 DIAGNOSIS — F0391 Unspecified dementia with behavioral disturbance: Secondary | ICD-10-CM | POA: Diagnosis not present

## 2018-05-25 DIAGNOSIS — Z9189 Other specified personal risk factors, not elsewhere classified: Secondary | ICD-10-CM | POA: Diagnosis not present

## 2018-05-25 DIAGNOSIS — E871 Hypo-osmolality and hyponatremia: Secondary | ICD-10-CM | POA: Diagnosis not present

## 2018-05-26 DIAGNOSIS — F0391 Unspecified dementia with behavioral disturbance: Secondary | ICD-10-CM | POA: Diagnosis not present

## 2018-05-27 DIAGNOSIS — Y92239 Unspecified place in hospital as the place of occurrence of the external cause: Secondary | ICD-10-CM | POA: Diagnosis not present

## 2018-05-27 DIAGNOSIS — F0391 Unspecified dementia with behavioral disturbance: Secondary | ICD-10-CM | POA: Diagnosis not present

## 2018-05-27 DIAGNOSIS — R404 Transient alteration of awareness: Secondary | ICD-10-CM | POA: Diagnosis not present

## 2018-05-27 DIAGNOSIS — D539 Nutritional anemia, unspecified: Secondary | ICD-10-CM | POA: Diagnosis not present

## 2018-05-27 DIAGNOSIS — E039 Hypothyroidism, unspecified: Secondary | ICD-10-CM | POA: Diagnosis not present

## 2018-05-27 DIAGNOSIS — R748 Abnormal levels of other serum enzymes: Secondary | ICD-10-CM | POA: Diagnosis not present

## 2018-05-27 DIAGNOSIS — G253 Myoclonus: Secondary | ICD-10-CM | POA: Diagnosis not present

## 2018-05-27 DIAGNOSIS — S62611D Displaced fracture of proximal phalanx of left index finger, subsequent encounter for fracture with routine healing: Secondary | ICD-10-CM | POA: Diagnosis not present

## 2018-05-27 DIAGNOSIS — E538 Deficiency of other specified B group vitamins: Secondary | ICD-10-CM | POA: Diagnosis not present

## 2018-05-27 DIAGNOSIS — W19XXXA Unspecified fall, initial encounter: Secondary | ICD-10-CM | POA: Diagnosis not present

## 2018-05-27 DIAGNOSIS — Z9181 History of falling: Secondary | ICD-10-CM | POA: Diagnosis not present

## 2018-05-28 DIAGNOSIS — F0391 Unspecified dementia with behavioral disturbance: Secondary | ICD-10-CM | POA: Diagnosis not present

## 2018-10-28 DEATH — deceased

## 2019-03-26 IMAGING — CT CT MAXILLOFACIAL W/O CM
3 series · 16 of 47 positions shown, 19 images · non-contrast
Comparison: 08/01/2017 CT head.

CLINICAL DATA: 77 y/o M; multiple falls over last week, nasal
fracture suspected.

EXAM:
CT MAXILLOFACIAL WITHOUT CONTRAST
TECHNIQUE: Multidetector CT imaging of the maxillofacial structures was
performed. Multiplanar CT image reconstructions were also generated.

[Series 3: facialbone 2.0 st · axial · 0.38mm/px · z∈[-200,-34]mm · 10 of 97 slices shown, 13 images]
[im 7/97  brain]
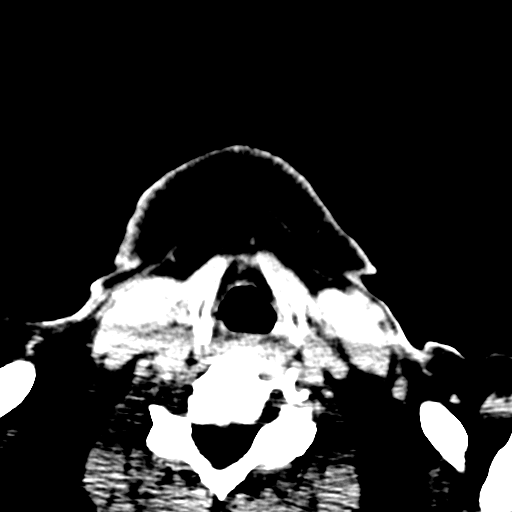
[im 7/97  bone]
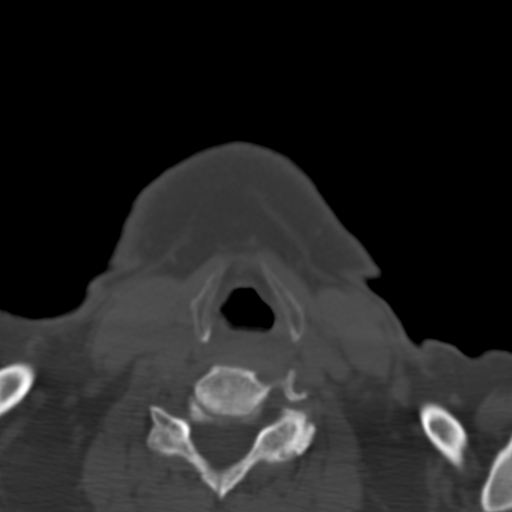
[im 17/97  bone]
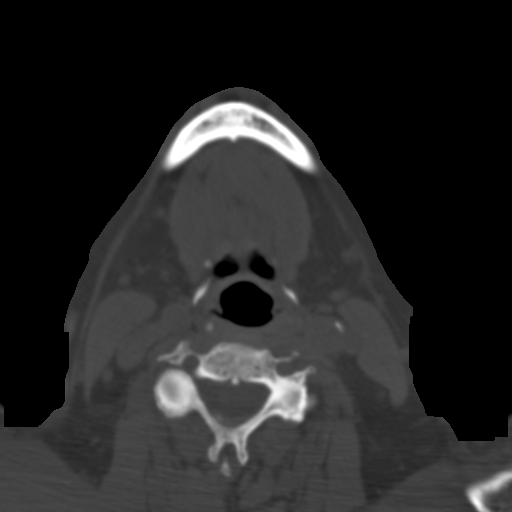
[im 27/97  bone]
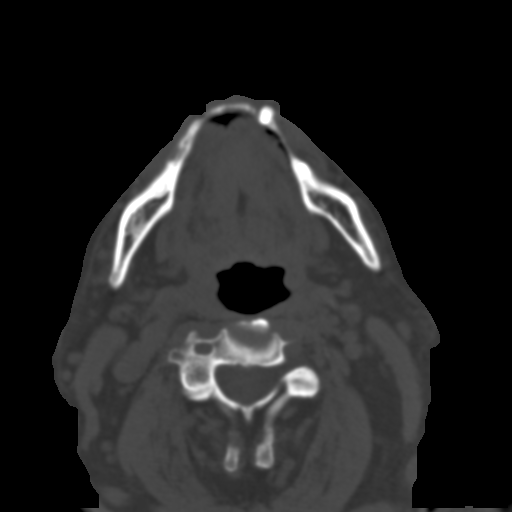
[im 34/97  bone]
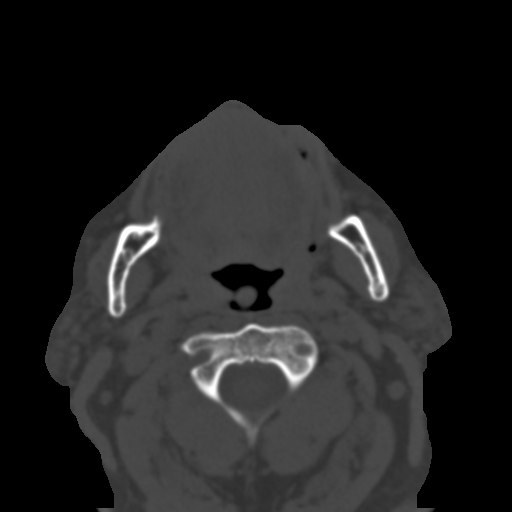
[im 44/97  brain]
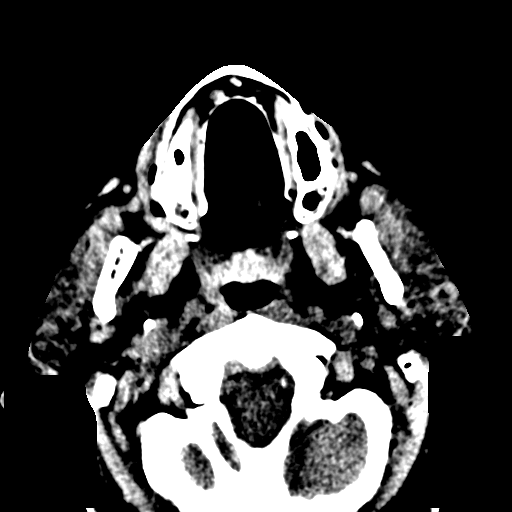
[im 44/97  bone]
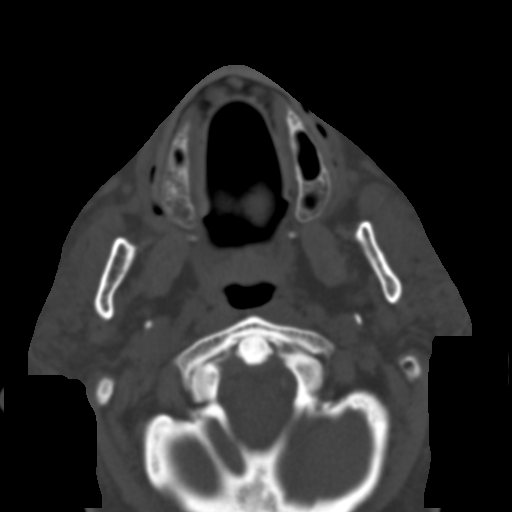
[im 53/97  bone]
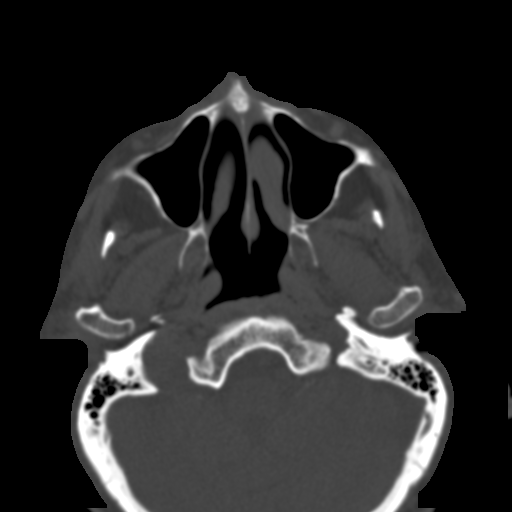
[im 63/97  bone]
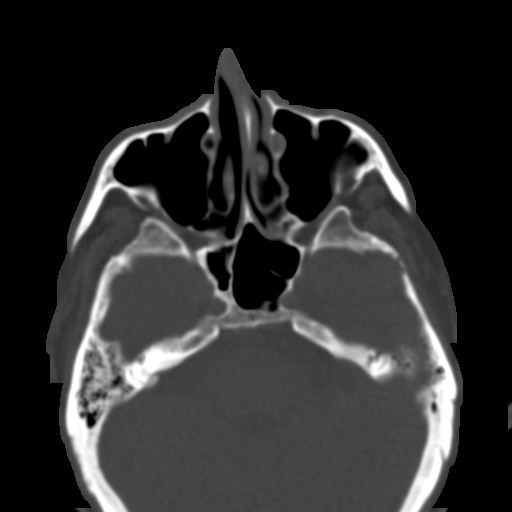
[im 73/97  bone]
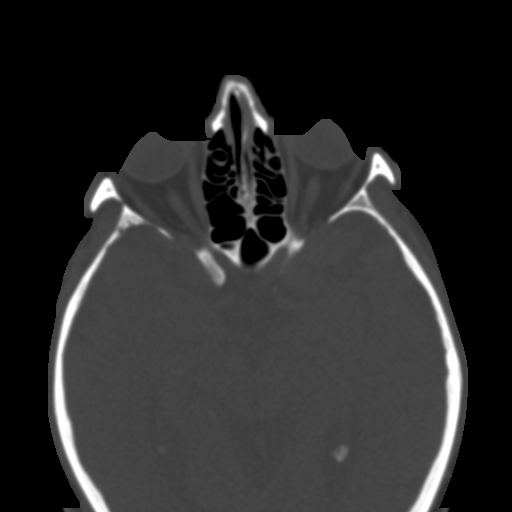
[im 80/97  brain]
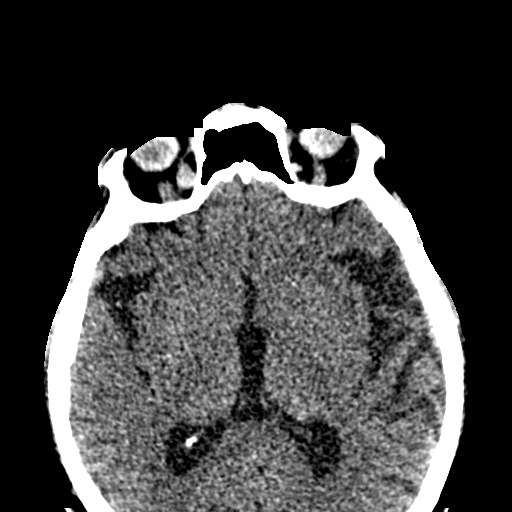
[im 80/97  bone]
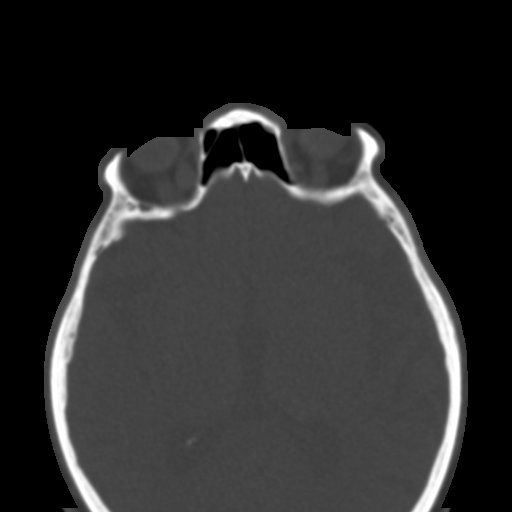
[im 90/97  bone]
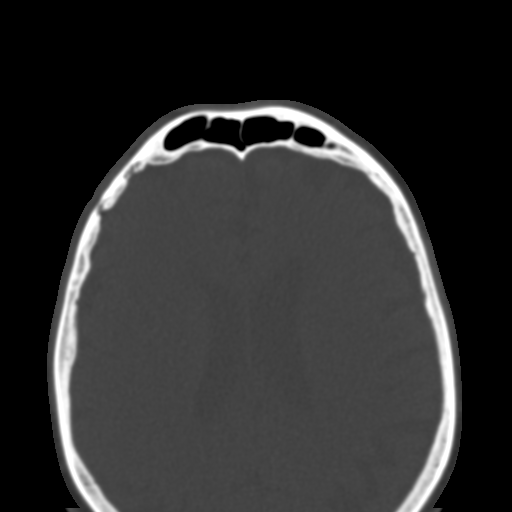

[Series 7: facialbone 2.0 cor st · coronal · 0.37mm/px · 3 of 91 slices shown]
[im 31/91  bone]
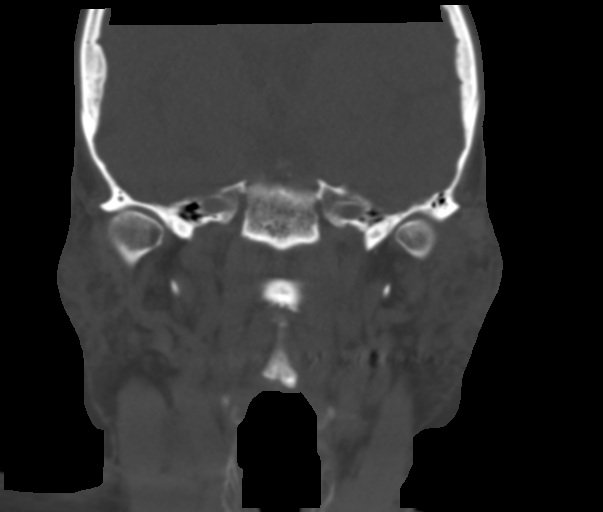
[im 41/91  bone]
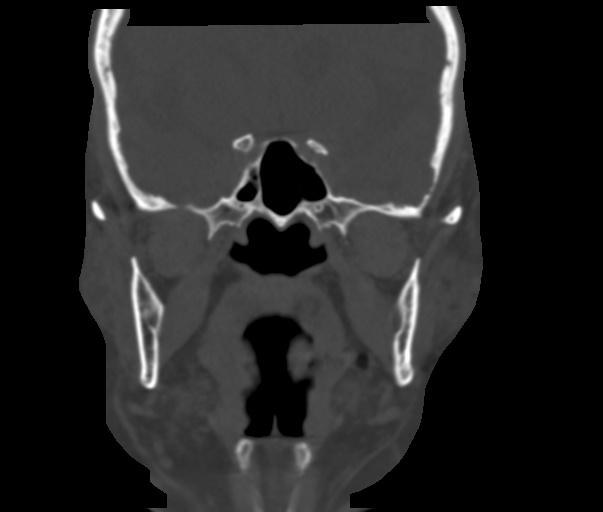
[im 51/91  bone]
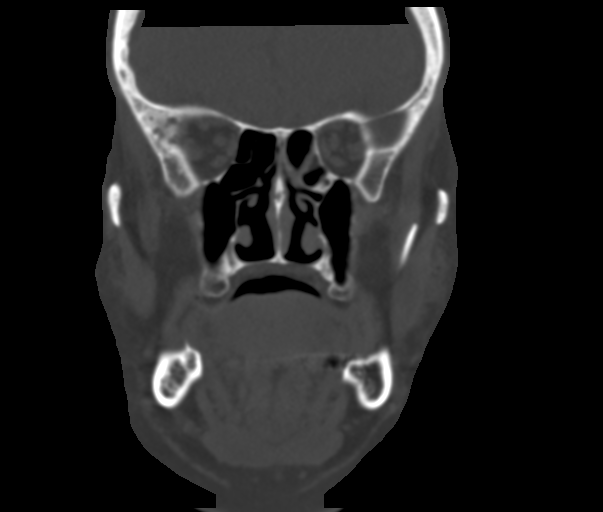

[Series 8: facialbone 2.0 sag st · sagittal · 0.36mm/px · 3 of 83 slices shown]
[im 28/83  bone]
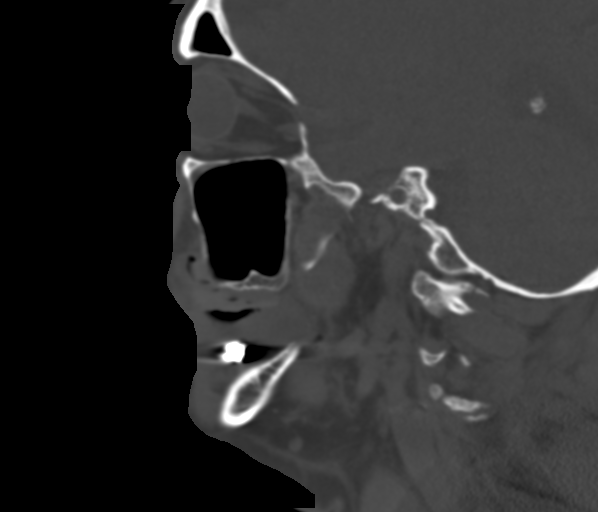
[im 42/83  bone]
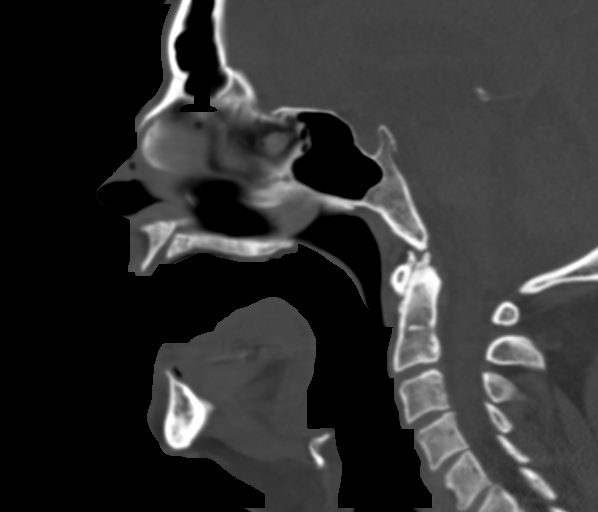
[im 55/83  bone]
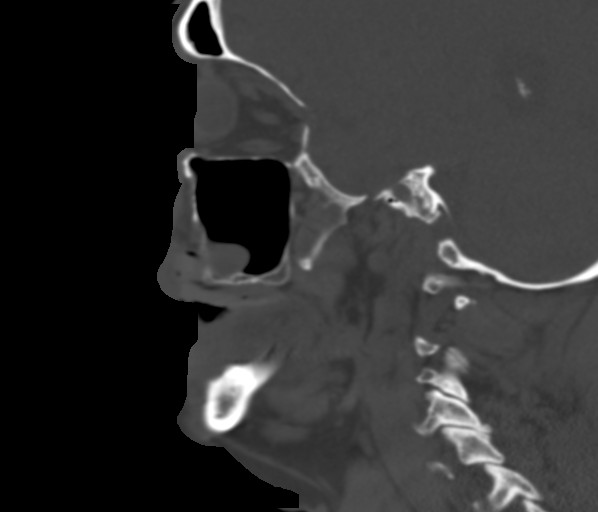

[16 of 47 positions shown; findings below may reference images not displayed]

FINDINGS: Osseous: Minimally displaced fractures of the left nasal bone and
anterior most nasal septum (series 4, image 70 and series 5, image
18). No other facial fracture identified.

Orbits: Negative. No traumatic or inflammatory finding.

Sinuses: Right maxillary sinus mucous retention cyst and aerosolized
secretions in the right sphenoid sinus. Otherwise negative.

Soft tissues: Negative.

Limited intracranial: No significant or unexpected finding.
IMPRESSION: Minimally displaced fractures of the left nasal bone and anterior
most nasal septum. No other fracture identified.

By: Miguer Gambetta M.D.

## 2019-03-27 IMAGING — DX DG CHEST 2V
2 series · 2 of 2 positions shown · non-contrast
Comparison: Radiographs and CT 07/17/2016

CLINICAL DATA: Altered mental status. Four falls over the past
week.

EXAM:
CHEST - 2 VIEW

[chest lat]
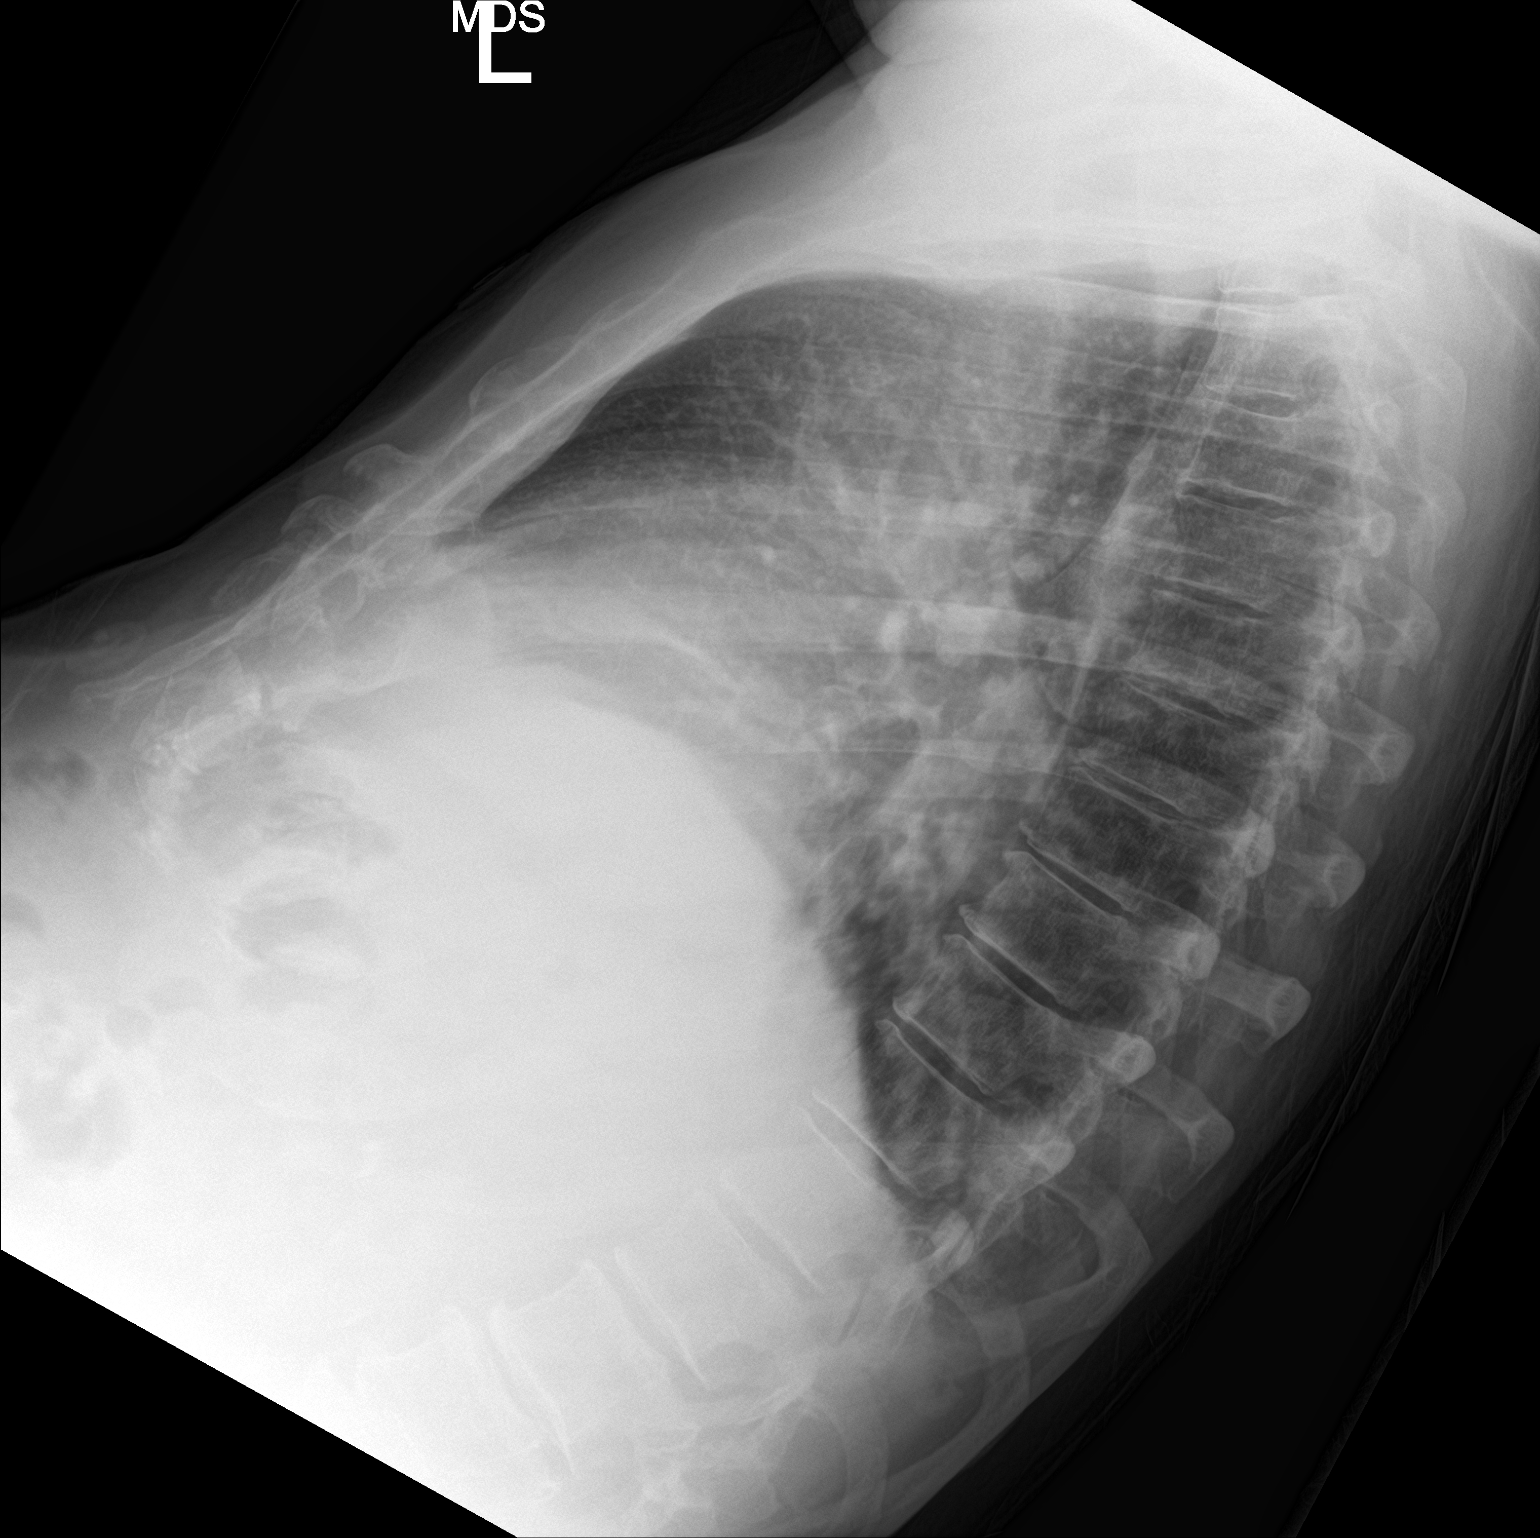

[chest ap]
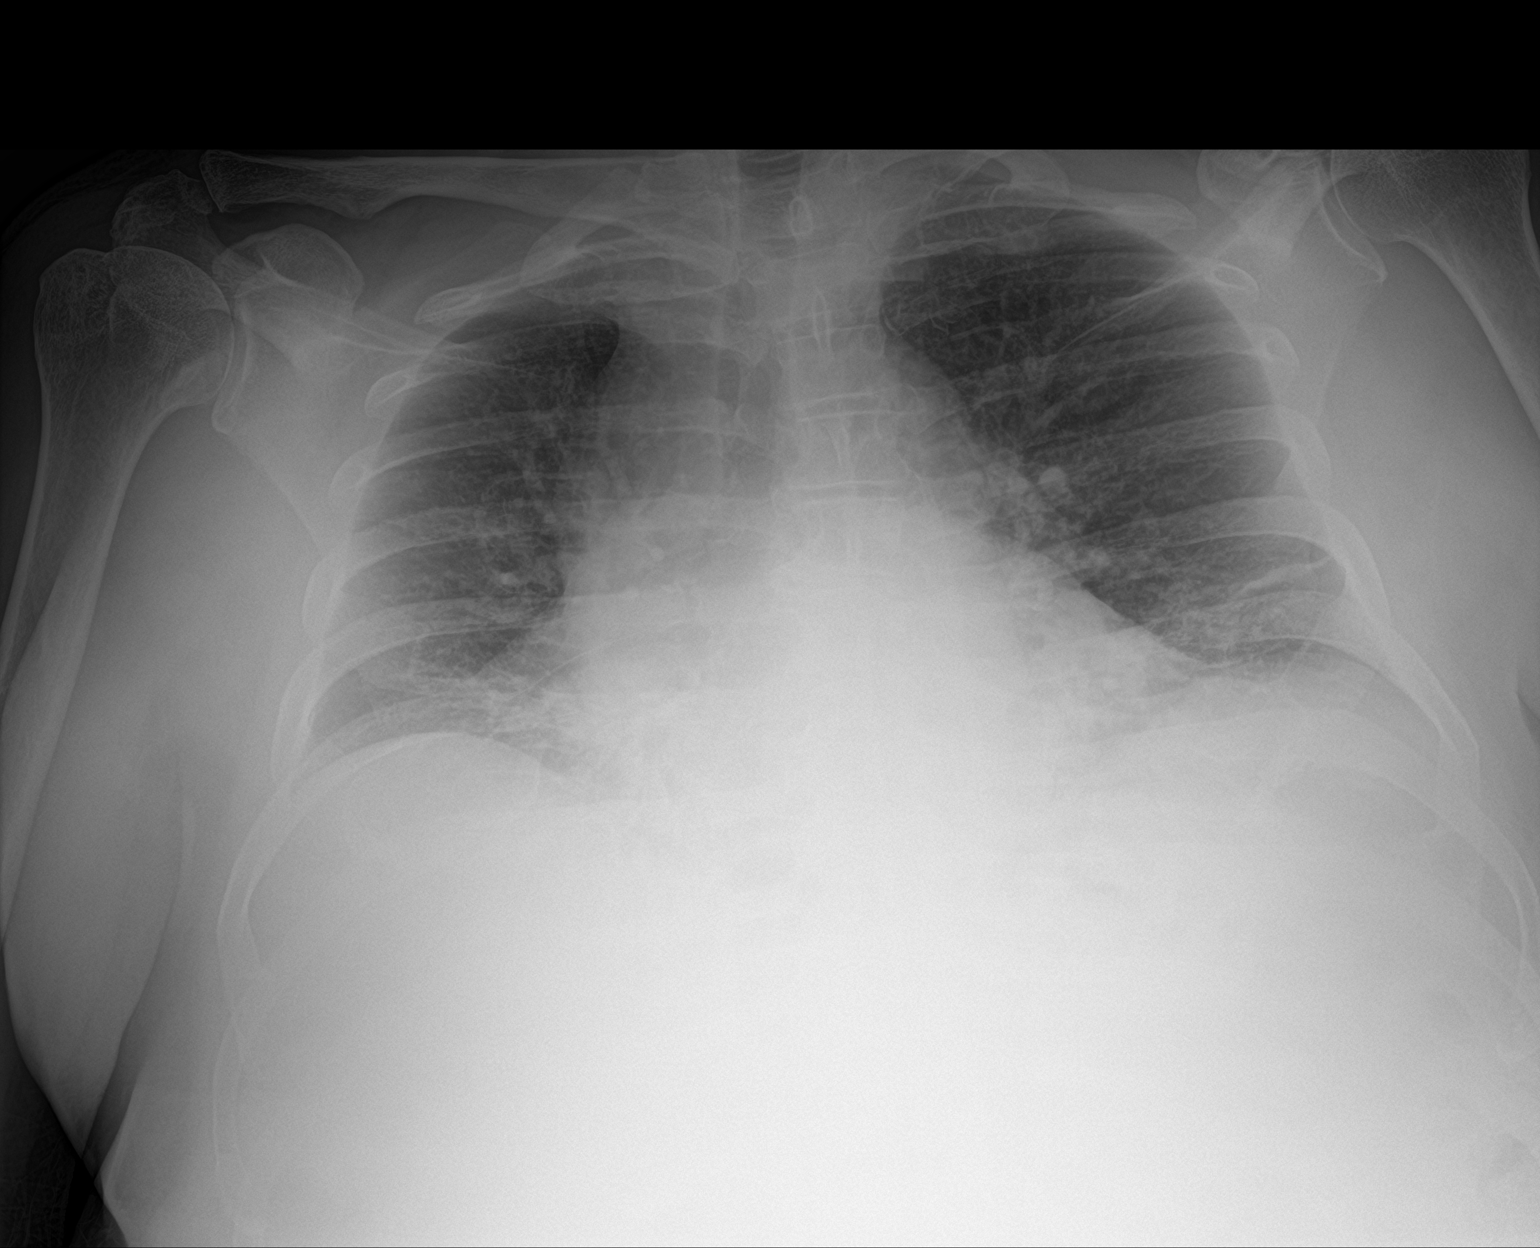

[2 of 2 positions shown; findings below may reference images not displayed]

FINDINGS: Low lung volumes. Bronchovascular crowding and accentuated heart
size secondary to hypoaeration. Streaky bibasilar atelectasis.
Possible tiny residual right pleural effusion. No confluent airspace
disease. No pneumothorax. No acute osseous abnormalities are seen.
IMPRESSION: Hypoventilatory chest with bronchovascular crowding and bibasilar
atelectasis. Possible right pleural effusion.
